# Patient Record
Sex: Female | Born: 1990 | Race: White | Hispanic: No | Marital: Single | Smoking: Light tobacco smoker
Health system: Southern US, Community
[De-identification: ages and names within clinical notes are randomized; demographics above are authoritative.]

## PROBLEM LIST (undated history)

## (undated) DIAGNOSIS — J45909 Unspecified asthma, uncomplicated: Secondary | ICD-10-CM

## (undated) DIAGNOSIS — IMO0002 Reserved for concepts with insufficient information to code with codable children: Secondary | ICD-10-CM

## (undated) HISTORY — PX: NO PAST SURGERIES: SHX2092

---
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
## 2009-04-27 ENCOUNTER — Emergency Department (HOSPITAL_COMMUNITY): Admission: EM | Admit: 2009-04-27 | Discharge: 2009-04-27 | Payer: Self-pay | Admitting: Emergency Medicine

## 2011-04-09 LAB — POCT PREGNANCY, URINE: Preg Test, Ur: NEGATIVE

## 2011-10-12 ENCOUNTER — Encounter: Payer: Self-pay | Admitting: *Deleted

## 2011-10-12 ENCOUNTER — Emergency Department (INDEPENDENT_AMBULATORY_CARE_PROVIDER_SITE_OTHER): Payer: Self-pay

## 2011-10-12 ENCOUNTER — Emergency Department (HOSPITAL_BASED_OUTPATIENT_CLINIC_OR_DEPARTMENT_OTHER)
Admission: EM | Admit: 2011-10-12 | Discharge: 2011-10-12 | Disposition: A | Discharge status: Home / Self Care | Payer: Self-pay | Attending: Emergency Medicine | Admitting: Emergency Medicine

## 2011-10-12 DIAGNOSIS — S86819A Strain of other muscle(s) and tendon(s) at lower leg level, unspecified leg, initial encounter: Secondary | ICD-10-CM | POA: Insufficient documentation

## 2011-10-12 DIAGNOSIS — M25569 Pain in unspecified knee: Secondary | ICD-10-CM

## 2011-10-12 DIAGNOSIS — S838X9A Sprain of other specified parts of unspecified knee, initial encounter: Secondary | ICD-10-CM | POA: Insufficient documentation

## 2011-10-12 DIAGNOSIS — S86919A Strain of unspecified muscle(s) and tendon(s) at lower leg level, unspecified leg, initial encounter: Secondary | ICD-10-CM

## 2011-10-12 DIAGNOSIS — F17200 Nicotine dependence, unspecified, uncomplicated: Secondary | ICD-10-CM | POA: Insufficient documentation

## 2011-10-12 DIAGNOSIS — X58XXXA Exposure to other specified factors, initial encounter: Secondary | ICD-10-CM

## 2011-10-12 DIAGNOSIS — W010XXA Fall on same level from slipping, tripping and stumbling without subsequent striking against object, initial encounter: Secondary | ICD-10-CM | POA: Insufficient documentation

## 2011-10-12 MED ORDER — TRAMADOL HCL 50 MG PO TABS: 50.0000 mg | ORAL_TABLET | Freq: Four times a day (QID) | ORAL | Status: AC | PRN

## 2011-10-12 NOTE — ED Notes (Signed)
Care plan pain  controll and follow up reviewed with Pt and family at side

## 2011-10-12 NOTE — ED Notes (Signed)
Knee immobilizer applied  With increased comfort Gait training with crutches pt demonstrates well

## 2011-10-12 NOTE — ED Provider Notes (Signed)
History     CSN: [id number] Arrival date & time: 10/12/2011  2:27 PM  Chief Complaint  Patient presents with  . Knee Injury    (Consider location/radiation/quality/duration/timing/severity/associated sxs/prior treatment) HPI Comments: Pt states that she tripped over a bucket and landed on her knee:pt states that she is having a problem bending the area:pt able to bend without any problem  Patient is a 20 y.o. female presenting with knee pain. The history is provided by the patient. No language interpreter was used.  Knee Pain This is a new problem. The current episode started yesterday. The problem occurs constantly. The problem has been unchanged. Pertinent negatives include no weakness. The symptoms are aggravated by nothing. She has tried nothing for the symptoms.    History reviewed. No pertinent past medical history.  History reviewed. No pertinent past surgical history.  No family history on file.  History  Substance Use Topics  . Smoking status: Current Everyday Smoker  . Smokeless tobacco: Not on file  . Alcohol Use: Yes    OB History    Grav Para Term Preterm Abortions TAB SAB Ect Mult Living                  Review of Systems  Constitutional: Negative.   Respiratory: Negative.   Cardiovascular: Negative.   Neurological: Negative for weakness.    Allergies  Review of patient's allergies indicates no known allergies.  Home Medications   Current Outpatient Rx  Name Route Sig Dispense Refill  . ORTHO TRI-CYCLEN (28) PO Oral Take by mouth.        BP 119/68  Pulse 88  Temp(Src) 98.3 F (36.8 C) (Oral)  Resp 18  Ht 5\' 2"  (1.575 m)  Wt 135 lb (61.236 kg)  BMI 24.69 kg/m2  SpO2 97%  LMP 10/05/2011  Physical Exam  Nursing note and vitals reviewed. Constitutional: She is oriented to person, place, and time. She appears well-developed and well-nourished.  Cardiovascular: Normal rate and regular rhythm.   Pulmonary/Chest: Effort normal and breath  sounds normal.  Musculoskeletal: Normal range of motion.       Pt has no gross deformity or swelling noted to the left knee  Neurological: She is alert and oriented to person, place, and time.  Skin: Skin is warm and dry.  Psychiatric: She has a normal mood and affect.    ED Course  Procedures (including critical care time)  Labs Reviewed - No data to display Dg Knee Complete 4 Views Left  10/12/2011  *RADIOLOGY REPORT*  Clinical Data: After injury.  LEFT KNEE - COMPLETE 4+ VIEW  Comparison: None.  Findings: No evidence for fracture or dislocation.  There is no joint effusion.  No worrisome lytic or sclerotic osseous lesion.  IMPRESSION: Normal exam.  Original Report Authenticated By: ERIC A. MANSELL, M.D.     1. Knee strain       MDM  Pt splinted for comfort:no obvious deformity        Teressa Lower, NP 10/12/11 1528

## 2011-10-12 NOTE — ED Notes (Signed)
Knee pain/knee pain with decreased mobility

## 2011-10-12 NOTE — ED Notes (Signed)
Pt states she tripped over a bucket and her left knee landed on a starter.

## 2011-10-12 NOTE — ED Notes (Signed)
Knee tender to Touch Ice pack applied

## 2011-10-13 NOTE — ED Provider Notes (Signed)
Medical screening examination/treatment/procedure(s) were performed by non-physician practitioner and as supervising physician I was immediately available for consultation/collaboration.   Yamin Swingler A Lyndzie Zentz, MD 10/13/11 2350 

## 2011-10-14 ENCOUNTER — Encounter (HOSPITAL_BASED_OUTPATIENT_CLINIC_OR_DEPARTMENT_OTHER): Payer: Self-pay | Admitting: Emergency Medicine

## 2011-10-14 ENCOUNTER — Emergency Department (HOSPITAL_BASED_OUTPATIENT_CLINIC_OR_DEPARTMENT_OTHER)
Admission: EM | Admit: 2011-10-14 | Discharge: 2011-10-14 | Disposition: A | Discharge status: Home / Self Care | Payer: Self-pay | Attending: Emergency Medicine | Admitting: Emergency Medicine

## 2011-10-14 DIAGNOSIS — M25569 Pain in unspecified knee: Secondary | ICD-10-CM | POA: Insufficient documentation

## 2011-10-14 DIAGNOSIS — W19XXXA Unspecified fall, initial encounter: Secondary | ICD-10-CM | POA: Insufficient documentation

## 2011-10-14 DIAGNOSIS — F17200 Nicotine dependence, unspecified, uncomplicated: Secondary | ICD-10-CM | POA: Insufficient documentation

## 2011-10-14 NOTE — ED Provider Notes (Signed)
History     CSN: [id number] Arrival date & time: 10/14/2011  5:16 PM   First MD Initiated Contact with Patient 10/14/11 1708      Chief Complaint  Patient presents with  . Knee Pain    L knee pain with decreased ROM seen 10-12-11 for knee injury    (Consider location/radiation/quality/duration/timing/severity/associated sxs/prior treatment) HPI Comments: Pt states that she fell 3 days ago and is continuing to have pain:pt states that she was seen and had an x-ray and it was negative  Patient is a 20 y.o. female presenting with knee pain. The history is provided by the patient. No language interpreter was used.  Knee Pain This is a new problem. The current episode started in the past 7 days. The problem occurs constantly. The problem has been unchanged. The symptoms are aggravated by bending. She has tried immobilization for the symptoms. The treatment provided no relief.    History reviewed. No pertinent past medical history.  History reviewed. No pertinent past surgical history.  History reviewed. No pertinent family history.  History  Substance Use Topics  . Smoking status: Current Everyday Smoker  . Smokeless tobacco: Not on file  . Alcohol Use: Yes    OB History    Grav Para Term Preterm Abortions TAB SAB Ect Mult Living                  Review of Systems  All other systems reviewed and are negative.    Allergies  Review of patient's allergies indicates no known allergies.  Home Medications   Current Outpatient Rx  Name Route Sig Dispense Refill  . ORTHO TRI-CYCLEN (28) PO Oral Take 1 tablet by mouth daily.     . TRAMADOL HCL 50 MG PO TABS Oral Take 1 tablet (50 mg total) by mouth every 6 (six) hours as needed for pain. 15 tablet 0    BP 110/69  Pulse 90  Temp 98.6 F (37 C)  Resp 20  SpO2 99%  LMP 10/05/2011  Physical Exam  Nursing note and vitals reviewed. Constitutional: She is oriented to person, place, and time. She appears well-developed  and well-nourished.  Cardiovascular: Normal rate and regular rhythm.   Pulmonary/Chest: Effort normal and breath sounds normal.  Musculoskeletal: Normal range of motion. She exhibits no edema and no tenderness.  Neurological: She is alert and oriented to person, place, and time.  Skin: Skin is warm and dry.    ED Course  Procedures (including critical care time)  Labs Reviewed - No data to display No results found.   No diagnosis found.    MDM  Pt had x-ray 3 days ago:no acute findings:pt has no abnormalities noted to the knee       Teressa Lower, NP 10/14/11 1738

## 2011-10-14 NOTE — ED Provider Notes (Signed)
Medical screening examination/treatment/procedure(s) were performed by non-physician practitioner and as supervising physician I was immediately available for consultation/collaboration.  Shelda Jakes, MD 10/14/11 [phone]

## 2011-10-14 NOTE — ED Notes (Signed)
Pt reports L knee is painful with decreased ROM followed all of care plan as directed

## 2012-10-22 DIAGNOSIS — N390 Urinary tract infection, site not specified: Secondary | ICD-10-CM | POA: Insufficient documentation

## 2012-10-22 DIAGNOSIS — F17200 Nicotine dependence, unspecified, uncomplicated: Secondary | ICD-10-CM | POA: Insufficient documentation

## 2012-10-22 DIAGNOSIS — R0982 Postnasal drip: Secondary | ICD-10-CM | POA: Insufficient documentation

## 2012-10-22 DIAGNOSIS — Z79899 Other long term (current) drug therapy: Secondary | ICD-10-CM | POA: Insufficient documentation

## 2012-10-23 ENCOUNTER — Emergency Department (HOSPITAL_BASED_OUTPATIENT_CLINIC_OR_DEPARTMENT_OTHER): Payer: Self-pay

## 2012-10-23 ENCOUNTER — Emergency Department (HOSPITAL_BASED_OUTPATIENT_CLINIC_OR_DEPARTMENT_OTHER)
Admission: EM | Admit: 2012-10-23 | Discharge: 2012-10-23 | Disposition: A | Discharge status: Home / Self Care | Payer: Self-pay | Attending: Emergency Medicine | Admitting: Emergency Medicine

## 2012-10-23 ENCOUNTER — Encounter (HOSPITAL_BASED_OUTPATIENT_CLINIC_OR_DEPARTMENT_OTHER): Payer: Self-pay | Admitting: *Deleted

## 2012-10-23 DIAGNOSIS — R0982 Postnasal drip: Secondary | ICD-10-CM

## 2012-10-23 DIAGNOSIS — N390 Urinary tract infection, site not specified: Secondary | ICD-10-CM

## 2012-10-23 LAB — URINALYSIS, ROUTINE W REFLEX MICROSCOPIC
Nitrite: NEGATIVE
Protein, ur: NEGATIVE mg/dL
Urobilinogen, UA: 0.2 mg/dL (ref 0.0–1.0)

## 2012-10-23 LAB — PREGNANCY, URINE: Preg Test, Ur: NEGATIVE

## 2012-10-23 LAB — URINE MICROSCOPIC-ADD ON

## 2012-10-23 MED ORDER — FEXOFENADINE HCL 60 MG PO TABS: 60.0000 mg | ORAL_TABLET | Freq: Two times a day (BID) | ORAL | Status: DC

## 2012-10-23 MED ORDER — FLUTICASONE PROPIONATE 50 MCG/ACT NA SUSP: 2.0000 | Freq: Every day | NASAL | Status: DC

## 2012-10-23 MED ORDER — NITROFURANTOIN MONOHYD MACRO 100 MG PO CAPS: 100.0000 mg | ORAL_CAPSULE | Freq: Two times a day (BID) | ORAL | Status: DC

## 2012-10-23 MED ORDER — LORATADINE 10 MG PO TABS
10.0000 mg | ORAL_TABLET | Freq: Once | ORAL | Status: AC
  Administered 2012-10-23: 10 mg via ORAL
  Filled 2012-10-23: qty 1

## 2012-10-23 NOTE — ED Provider Notes (Signed)
History     CSN: [id number]  Arrival date & time 10/22/12  2359   First MD Initiated Contact with Patient 10/23/12 [phone]      Chief Complaint  Patient presents with  . Cough    (Consider location/radiation/quality/duration/timing/severity/associated sxs/prior treatment) Patient is a 21 y.o. female presenting with cough. The history is provided by the patient. No language interpreter was used.  Cough This is a new problem. The current episode started more than 1 week ago. The problem occurs every few minutes. The problem has not changed since onset.The cough is non-productive. There has been no fever. Associated symptoms include rhinorrhea. Pertinent negatives include no chest pain, no chills, no sweats, no weight loss, no ear congestion, no ear pain, no headaches, no sore throat, no myalgias and no shortness of breath. She has tried nothing for the symptoms. The treatment provided no relief. She is a smoker. Her past medical history does not include pneumonia.  Also suprapubic tenderness x 1 week.  No vaginal discharge.  No flank pain no n/v/d.    History reviewed. No pertinent past medical history.  History reviewed. No pertinent past surgical history.  History reviewed. No pertinent family history.  History  Substance Use Topics  . Smoking status: Current Every Day Smoker  . Smokeless tobacco: Not on file  . Alcohol Use: Yes    OB History    Grav Para Term Preterm Abortions TAB SAB Ect Mult Living                  Review of Systems  Constitutional: Negative for chills and weight loss.  HENT: Positive for rhinorrhea. Negative for ear pain and sore throat.   Respiratory: Positive for cough. Negative for shortness of breath.   Cardiovascular: Negative for chest pain, palpitations and leg swelling.  Genitourinary: Negative for dysuria, flank pain, decreased urine volume and vaginal discharge.  Musculoskeletal: Negative for myalgias.  Neurological: Negative for headaches.    All other systems reviewed and are negative.    Allergies  Review of patient's allergies indicates no known allergies.  Home Medications   Current Outpatient Rx  Name Route Sig Dispense Refill  . ORTHO TRI-CYCLEN (28) PO Oral Take 1 tablet by mouth daily.       BP 128/63  Pulse 86  Temp 98.6 F (37 C) (Oral)  Resp 16  Ht 5\' 2"  (1.575 m)  Wt 120 lb (54.432 kg)  BMI 21.95 kg/m2  SpO2 98%  LMP 09/20/2012  Physical Exam  Constitutional: She is oriented to person, place, and time. She appears well-developed and well-nourished. No distress.  HENT:  Head: Normocephalic and atraumatic.  Right Ear: External ear normal.  Left Ear: External ear normal.  Mouth/Throat: Oropharynx is clear and moist.       Oropharyngeal cobblestoning  Eyes: Conjunctivae normal are normal. Pupils are equal, round, and reactive to light.  Neck: Normal range of motion. Neck supple.  Cardiovascular: Normal rate and regular rhythm.   Pulmonary/Chest: Effort normal and breath sounds normal. She has no wheezes. She has no rales.  Abdominal: Soft. Bowel sounds are normal. There is no tenderness. There is no rebound and no guarding.  Musculoskeletal: Normal range of motion.  Neurological: She is alert and oriented to person, place, and time.  Skin: Skin is warm and dry.  Psychiatric: She has a normal mood and affect.    ED Course  Procedures (including critical care time)  Labs Reviewed  URINALYSIS, ROUTINE W REFLEX MICROSCOPIC -  Abnormal; Notable for the following:    APPearance CLOUDY (*)     Hgb urine dipstick LARGE (*)     Leukocytes, UA TRACE (*)     All other components within normal limits  URINE MICROSCOPIC-ADD ON - Abnormal; Notable for the following:    Squamous Epithelial / LPF FEW (*)     Bacteria, UA MANY (*)     Crystals CA OXALATE CRYSTALS (*)     All other components within normal limits  PREGNANCY, URINE  URINE CULTURE   Dg Chest 2 View  10/23/2012  *RADIOLOGY REPORT*   Clinical Data: Cough  CHEST - 2 VIEW  Comparison: None.  Findings: Mild bronchitic changes.  No focal consolidation.  No pleural effusion or pneumothorax.  Cardiomediastinal silhouette is within normal limits.  Visualized osseous structures are within normal limits.  IMPRESSION: Mild bronchitic changes.   Original Report Authenticated By: Charline Bills, M.D.      No diagnosis found.   MDM  Will treat for UTI and post nasal drip.  Return for fevers chills flank pain or any concerns        Crickett Abbett K Oluwaseyi Raffel-Rasch, MD 10/23/12 1610

## 2012-10-23 NOTE — ED Notes (Signed)
Nonproductive cough x 1 week, denies fever. Also c/o clear nasal drainage.

## 2012-10-25 LAB — URINE CULTURE

## 2012-10-26 NOTE — ED Notes (Signed)
+   Urine Patient treated with Macrobid-Sensitive to same-chart appended per protocol MD.

## 2013-01-05 ENCOUNTER — Emergency Department (HOSPITAL_COMMUNITY)
Admission: EM | Admit: 2013-01-05 | Discharge: 2013-01-05 | Disposition: A | Discharge status: Home / Self Care | Payer: Self-pay | Attending: Emergency Medicine | Admitting: Emergency Medicine

## 2013-01-05 ENCOUNTER — Encounter (HOSPITAL_COMMUNITY): Payer: Self-pay

## 2013-01-05 DIAGNOSIS — Z79899 Other long term (current) drug therapy: Secondary | ICD-10-CM | POA: Insufficient documentation

## 2013-01-05 DIAGNOSIS — J45909 Unspecified asthma, uncomplicated: Secondary | ICD-10-CM | POA: Insufficient documentation

## 2013-01-05 DIAGNOSIS — F17200 Nicotine dependence, unspecified, uncomplicated: Secondary | ICD-10-CM | POA: Insufficient documentation

## 2013-01-05 DIAGNOSIS — Z3202 Encounter for pregnancy test, result negative: Secondary | ICD-10-CM | POA: Insufficient documentation

## 2013-01-05 DIAGNOSIS — R112 Nausea with vomiting, unspecified: Secondary | ICD-10-CM | POA: Insufficient documentation

## 2013-01-05 HISTORY — DX: Unspecified asthma, uncomplicated: J45.909

## 2013-01-05 LAB — URINALYSIS, ROUTINE W REFLEX MICROSCOPIC
Bilirubin Urine: NEGATIVE
Specific Gravity, Urine: >1.03 — ABNORMAL HIGH (ref 1.005–1.030)
Urobilinogen, UA: 0.2 mg/dL (ref 0.0–1.0)

## 2013-01-05 LAB — POCT I-STAT, CHEM 8
BUN: 16 mg/dL (ref 6–23)
Creatinine, Ser: 0.8 mg/dL (ref 0.50–1.10)
Potassium: 4.4 mEq/L (ref 3.5–5.1)
Sodium: 140 mEq/L (ref 135–145)

## 2013-01-05 LAB — URINE MICROSCOPIC-ADD ON

## 2013-01-05 MED ORDER — ONDANSETRON HCL 4 MG PO TABS: 8.0000 mg | ORAL_TABLET | Freq: Four times a day (QID) | ORAL | Status: AC

## 2013-01-05 MED ORDER — ONDANSETRON 4 MG PO TBDP
8.0000 mg | ORAL_TABLET | Freq: Once | ORAL | Status: AC
  Administered 2013-01-05: 8 mg via ORAL
  Filled 2013-01-05: qty 2

## 2013-01-05 NOTE — ED Provider Notes (Signed)
History     CSN: [id number]  Arrival date & time 01/05/13  1441   First MD Initiated Contact with Patient 01/05/13 [phone]      Chief Complaint  Patient presents with  . Emesis    (Consider location/radiation/quality/duration/timing/severity/associated sxs/prior treatment) HPI Patient has vomited multiple times today. She presently complains of nausea. She denies chest pain denies headache denies abdominal pain last normal menstrual period was 4 days ago. No fever no other complaint treated herself with Tylenol yesterday after having dental work done to 2 tablets. No other complaint presently except nausea. She is not lightheaded upon standing. Past Medical History  Diagnosis Date  . Asthma     History reviewed. No pertinent past surgical history.  History reviewed. No pertinent family history.  History  Substance Use Topics  . Smoking status: Current Every Day Smoker  . Smokeless tobacco: Not on file  . Alcohol Use: Yes    OB History    Grav Para Term Preterm Abortions TAB SAB Ect Mult Living                  Review of Systems  Gastrointestinal: Positive for nausea and vomiting.  All other systems reviewed and are negative.    Allergies  Review of patient's allergies indicates no known allergies.  Home Medications   Current Outpatient Rx  Name  Route  Sig  Dispense  Refill  . ACETAMINOPHEN 500 MG PO TABS   Oral   Take 500 mg by mouth every 6 (six) hours as needed. For pain         . ALBUTEROL SULFATE HFA 108 (90 BASE) MCG/ACT IN AERS   Inhalation   Inhale 2 puffs into the lungs every 6 (six) hours as needed. For asthma           BP 113/60  Pulse 84  Temp 97.5 F (36.4 C) (Oral)  SpO2 96%  Physical Exam  Nursing note and vitals reviewed. Constitutional: She is oriented to person, place, and time. She appears well-developed and well-nourished. No distress.  HENT:  Head: Normocephalic and atraumatic.  Eyes: Conjunctivae normal are normal. Pupils  are equal, round, and reactive to light.       Funduscopic exam normal  Neck: Neck supple. No tracheal deviation present. No thyromegaly present.  Cardiovascular: Normal rate and regular rhythm.   No murmur heard. Pulmonary/Chest: Effort normal and breath sounds normal.  Abdominal: Soft. Bowel sounds are normal. She exhibits no distension. There is no tenderness.  Musculoskeletal: Normal range of motion. She exhibits no edema and no tenderness.  Neurological: She is alert and oriented to person, place, and time. Coordination normal.       Gait normal not lightheaded on standing  Skin: Skin is warm and dry. No rash noted.  Psychiatric: She has a normal mood and affect.    ED Course  Procedures (including critical care time)  Labs Reviewed - No data to display No results found.  Results for orders placed during the hospital encounter of 01/05/13  URINALYSIS, ROUTINE W REFLEX MICROSCOPIC      Component Value Range   Color, Urine YELLOW  YELLOW   APPearance HAZY (*) CLEAR   Specific Gravity, Urine >1.030 (*) 1.005 - 1.030   pH 5.5  5.0 - 8.0   Glucose, UA NEGATIVE  NEGATIVE mg/dL   Hgb urine dipstick LARGE (*) NEGATIVE   Bilirubin Urine NEGATIVE  NEGATIVE   Ketones, ur NEGATIVE  NEGATIVE mg/dL   Protein,  ur NEGATIVE  NEGATIVE mg/dL   Urobilinogen, UA 0.2  0.0 - 1.0 mg/dL   Nitrite NEGATIVE  NEGATIVE   Leukocytes, UA NEGATIVE  NEGATIVE  POCT PREGNANCY, URINE      Component Value Range   Preg Test, Ur NEGATIVE  NEGATIVE  POCT I-STAT, CHEM 8      Component Value Range   Sodium 140  135 - 145 mEq/L   Potassium 4.4  3.5 - 5.1 mEq/L   Chloride 108  96 - 112 mEq/L   BUN 16  6 - 23 mg/dL   Creatinine, Ser 8.11  0.50 - 1.10 mg/dL   Glucose, Bld 99  70 - 99 mg/dL   Calcium, Ion 9.14  1.12 - 1.23 mmol/L   TCO2 23  0 - 100 mmol/L   Hemoglobin 17.7 (*) 12.0 - 15.0 g/dL   HCT 78.2 (*) 95.6 - 21.3 %  URINE MICROSCOPIC-ADD ON      Component Value Range   Squamous Epithelial / LPF MANY  (*) RARE   WBC, UA 0-2  <3 WBC/hpf   RBC / HPF 0-2  <3 RBC/hpf   Bacteria, UA FEW (*) RARE   Urine-Other MUCOUS PRESENT     No results found.  No diagnosis found.  7 PM asymptomatic after treatment with Zofran  MDM  Etiology for nausea and vomiting unclear. Patient has not exhibited any signs of dehydration Plan prescription Zofran Referral resource guide  Diagnosis nausea and vomitting      Doug Sou, MD 01/05/13 0865

## 2013-01-05 NOTE — ED Notes (Signed)
Woke up today vomiting, diaphoretic.

## 2014-04-17 ENCOUNTER — Other Ambulatory Visit: Payer: Self-pay | Admitting: Obstetrics and Gynecology

## 2014-04-17 DIAGNOSIS — O3680X0 Pregnancy with inconclusive fetal viability, not applicable or unspecified: Secondary | ICD-10-CM

## 2014-04-19 ENCOUNTER — Ambulatory Visit (INDEPENDENT_AMBULATORY_CARE_PROVIDER_SITE_OTHER): Payer: Medicaid Other

## 2014-04-19 ENCOUNTER — Encounter: Payer: Self-pay | Admitting: Obstetrics and Gynecology

## 2014-04-19 ENCOUNTER — Ambulatory Visit (INDEPENDENT_AMBULATORY_CARE_PROVIDER_SITE_OTHER): Payer: Medicaid Other | Admitting: Obstetrics and Gynecology

## 2014-04-19 ENCOUNTER — Other Ambulatory Visit: Payer: Self-pay | Admitting: Obstetrics and Gynecology

## 2014-04-19 VITALS — BP 100/60 | Wt 127.0 lb

## 2014-04-19 DIAGNOSIS — O358XX0 Maternal care for other (suspected) fetal abnormality and damage, not applicable or unspecified: Secondary | ICD-10-CM

## 2014-04-19 DIAGNOSIS — Z331 Pregnant state, incidental: Secondary | ICD-10-CM

## 2014-04-19 DIAGNOSIS — IMO0002 Reserved for concepts with insufficient information to code with codable children: Secondary | ICD-10-CM

## 2014-04-19 DIAGNOSIS — Z1389 Encounter for screening for other disorder: Secondary | ICD-10-CM

## 2014-04-19 DIAGNOSIS — O3680X0 Pregnancy with inconclusive fetal viability, not applicable or unspecified: Secondary | ICD-10-CM

## 2014-04-19 DIAGNOSIS — Q793 Gastroschisis: Secondary | ICD-10-CM

## 2014-04-19 LAB — POCT URINALYSIS DIPSTICK
GLUCOSE UA: NEGATIVE
KETONES UA: NEGATIVE
Leukocytes, UA: NEGATIVE
Nitrite, UA: NEGATIVE
Protein, UA: NEGATIVE
RBC UA: NEGATIVE

## 2014-04-19 NOTE — Progress Notes (Signed)
Pt had US done today in our office and needs to go over findings with Dr. Emelda FearFerguson.

## 2014-04-19 NOTE — Progress Notes (Signed)
U/S(11+3wks)-CRL c/w LMP dates, bilateral adnexa appears WNL, FHR-167 BPM, ABDOMINAL WALL DEFECT NOTED FETAL STOMACH NOTED OUTSIDE OF ABDOMINAL CAVITY, HEART NOTED LOWER IN CHEST/ABDOMINAL CAVITY (UNABLE TO DIFFERENTIATE OOMPHALOCELE VS. GASTROSCHISIS),

## 2014-04-19 NOTE — Progress Notes (Signed)
U/S(11+3wks)-CRL c/w LMP dates, bilateral adnexa appears WNL, FHR-167 BPM, ABDOMINAL WALL DEFECT NOTED FETAL STOMACH NOTED OUTSIDE OF ABDOMINAL CAVITY, HEART NOTED LOWER IN CHEST/ABDOMINAL CAVITY (UNABLE TO DIFFERENTIATE OMPHALOCELE VS. GASTROSCHISIS), ?unable to clearly visualize normal spine curvature  Discussed U/S results with FOB and pt and had lengthy discussion as to risks and concerns. [facility]. Pt's questions answered to apparent satisfaction. [facility].  A: gastroschisis P:specialist f/u, return to office in 2 weeks

## 2014-04-21 ENCOUNTER — Telehealth: Payer: Self-pay

## 2014-04-21 NOTE — Telephone Encounter (Signed)
[facility] for Son[facility] and Genetic Specialist. Pt aware, records to be faxed.

## 2014-04-21 NOTE — Telephone Encounter (Signed)
Pt states Dr. Emelda FearFerguson was to refer [facility] due to fetal abnormalities identified on 04/19/2014 ultrasound. Informed pt Dr. Emelda FearFerguson our of office today will be back in the office on Monday will discuss with Dr. Despina HiddenEure who is in the office today.

## 2014-04-21 NOTE — Telephone Encounter (Signed)
As [facility]

## 2014-04-24 ENCOUNTER — Other Ambulatory Visit: Payer: Self-pay | Admitting: Obstetrics & Gynecology

## 2014-04-24 DIAGNOSIS — IMO0002 Reserved for concepts with insufficient information to code with codable children: Secondary | ICD-10-CM

## 2014-04-24 DIAGNOSIS — Z3689 Encounter for other specified antenatal screening: Secondary | ICD-10-CM

## 2014-04-24 DIAGNOSIS — O358XX0 Maternal care for other (suspected) fetal abnormality and damage, not applicable or unspecified: Secondary | ICD-10-CM

## 2014-04-28 ENCOUNTER — Ambulatory Visit (HOSPITAL_COMMUNITY)
Admission: RE | Admit: 2014-04-28 | Discharge: 2014-04-28 | Disposition: A | Discharge status: Home / Self Care | Payer: Medicaid Other | Source: Ambulatory Visit | Attending: Obstetrics & Gynecology | Admitting: Obstetrics & Gynecology

## 2014-04-28 ENCOUNTER — Other Ambulatory Visit: Payer: Self-pay | Admitting: Obstetrics & Gynecology

## 2014-04-28 ENCOUNTER — Encounter (HOSPITAL_COMMUNITY): Payer: Self-pay

## 2014-04-28 DIAGNOSIS — Z3689 Encounter for other specified antenatal screening: Secondary | ICD-10-CM

## 2014-04-28 DIAGNOSIS — O358XX0 Maternal care for other (suspected) fetal abnormality and damage, not applicable or unspecified: Secondary | ICD-10-CM | POA: Insufficient documentation

## 2014-04-28 DIAGNOSIS — IMO0002 Reserved for concepts with insufficient information to code with codable children: Secondary | ICD-10-CM | POA: Diagnosis not present

## 2014-04-28 NOTE — Progress Notes (Signed)
[facility]  Indication: 22 yr old G1P0 at [redacted]w[redacted]d with fetal abdominal wall defect seen on outside ultrasound for fetal ultrasound.  Findings: 1. Single intrauterine pregnancy. 2. Fetal crown rump length is consistent with dating. 3. Normal uterus; no adnexal masses seen. 4. Evaluation of fetal anatomy is limited by early gestational age. 5. 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 6. The fetal heart is displaced inferiorly. 7. The nuchal translucency appears enlarged.  Recommendations: 1. Appropriate dating. 2. 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 (discussed it contains liver and stomach) - discussed association with increased risk of fetal aneuploidy - discussed association with other anomalies (can be up to 70-80% chance of associated anomalies) - discussed prognosis depends on presence of associated anomalies and if there is a eupoloid or aneuploid fetus or other genetic condition - discussed prognosis also somewhat depends on the size of the defect - discussed need for possible multiple neonatal surgeries - discussed options of aneuploidy screening (see genetic counselor report) - discussed option of termination  Patient and her partner are undecided how they would like to proceed at this point- they will call us to let us know. If patient continues the pregnancy recommend detailed anatomic survey, fetal echocardiogram, serial fetal growth surveillance, and consultation with Pediatric Surgery  Discussed significantly increased risk of miscarriage and fetal demise Discussed will evaluate for associated anomalies on follow up ultrasounds as anatomy is limited today by early gestational age 3. Thickened  nuchal translucency: - see genetic counselor report - discussed association with increased risk of aneuploidy, major fetal anomalies, and fetal loss  Kristen Quinn, MD 

## 2014-04-28 NOTE — Progress Notes (Signed)
Genetic Counseling  High-Risk Gestation Note  Appointment Date:  04/28/2014 Referred By: Florian Buff, MD Date of Birth:  [birth year] Partner:  Earmon Phoenix   Pregnancy History: G1P0 Estimated Date of Delivery: 11/05/14 Estimated Gestational Age: 51w5dAttending: [place] MD   I met with Ms. ACitigroupand her partner, Mr. CEarmon Phoenix for genetic counseling because of abnormal ultrasound findings.  We began by reviewing the ultrasound in detail. Ultrasound performed at the time of today's visit confirmed the finding of abdominal wall defect. 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 The fetal heart is displaced inferiorly. Additionally, an enlarged nuchal translucency was visualized. Complete ultrasound results reported separately.     This couple was counseled that an omphalocele occurs in ~1 in every 4000 births (M1:F5) and is defined as the protrusion of abdominal viscera through the umbilical ring, covered by membrane.  This defect is thought to arise from failure of lateral body migration and body wall closure or from the embryonic persistence of the body stalk.  We discussed that approximately two thirds of all cases have associated anomalies, most commonly including: cardiac defects, neural tube defects, and cleft lip with or without palate.  We reviewed the common causes of omphaloceles, including sporadic, multifactorial, and genetic etiologies.  Specifically, we discussed that omphaloceles are associated with a 30-50% risk of fetal aneuploidy, complexes such as OEIS, and genetic syndromes including Beckwith-Wiedemann syndrome (BWS) and single gene conditions.  We briefly reviewed recessive and dominant inheritances, since there are reports of familial nonsyndromic omphaloceles. Various common etiologies for an increased NT include aneuploidy, single gene conditions, cardiac or great vessel abnormalities, lymphatic system failure, decreased fetal movement, and  fetal anemia.       We reviewed chromosomes, nondisjunction, and examples of fetal aneuploidy, including trisomy 179and trisomy 135 We reviewed available screening and diagnostic options.  Regarding screening tests, we discussed the options of noninvasive prenatal screening (NIPS)/cell free DNA testing and ultrasound.  They understand that screening tests are used to modify a patient's a priori risk for aneuploidy, typically based on age.  This estimate provides a pregnancy specific risk assessment. Specifically, we discussed the conditions for which each test screens, the detection rates, and false positive rates of each.  We also reviewed the availability of diagnostic options including CVS and amniocentesis.  We discussed the risks, limitations, and benefits of each including the associated 1 in 1450risk for complications from CVS and the associated 1 in 3388-828risk for complications from amniocentesis.  She understands that these screens and tests cannot rule out all birth defects or genetic syndromes. After careful consideration, the couple declined pursuing additional screening or testing at this time. Ms. MValdiviaindicated that she needed additional time to further consider screening and testing options. She plans to call our office back should she desire NIPS or CVS/amniocentesis.   We discussed management and prognosis.  They understand that the overall prognosis depends upon the underlying etiology; however, if isolated and nonsyndromic, the prognosis for omphalocele is relatively good, though risk for adverse pregnancy outcomes is increased. We discussed that postnatal surgical management would be more involved for large omphalocele. We discussed the options of continuing the pregnancy versus termination of pregnancy. The couple understands that [place].  We reviewed the option of meeting with a pediatric surgeon to review  the surgical approach and postnatal management.  In addition, we also discussed  the importance of delivering in tertiary care center, to optimize care of the newborn.   We also discussed the recommendation for fetal echocardiogram, given the increased association of omphaloceles with CHDs and detailed ultrasound in the second trimester. The couple indicated that they need additional time to think about how they would like to proceed with the pregnancy. Follow-up ultrasound was planned for 05/19/14.   Both family histories were reviewed and found to be noncontributory for birth defects, intellectual disability, and known genetic conditions. Without further information regarding the provided family history, an accurate genetic risk cannot be calculated. Further genetic counseling is warranted if more information is obtained.  Ms. Wallner was provided with written information regarding cystic fibrosis (CF) including the carrier frequency and incidence in the Caucasian population, the availability of carrier testing and prenatal diagnosis if indicated.  In addition, we discussed that CF is routinely screened for as part of the [place] newborn screening panel.  She declined further discussion regarding CF carrier screening at this time.    Ms. Riga denied exposure to environmental toxins or chemical agents. She denied the use of alcohol or street drugs. She reported smoking 2 cigarettes per day. The associations of smoking in pregnancy were reviewed and cessation encouraged. She denied significant viral illnesses during the course of her pregnancy. Her medical and surgical histories were noncontributory.   I counseled this couple regarding the above risks and available options.  The approximate face-to-face time with the genetic counselor was 35 minutes.  Kandra Nicolas Gildardo Griffes, MS Certified Genetic Counselor 04/28/2014

## 2014-04-30 ENCOUNTER — Encounter: Payer: Self-pay | Admitting: Obstetrics and Gynecology

## 2014-04-30 DIAGNOSIS — O283 Abnormal ultrasonic finding on antenatal screening of mother: Secondary | ICD-10-CM | POA: Insufficient documentation

## 2014-04-30 DIAGNOSIS — O358XX0 Maternal care for other (suspected) fetal abnormality and damage, not applicable or unspecified: Secondary | ICD-10-CM | POA: Insufficient documentation

## 2014-05-01 ENCOUNTER — Telehealth (HOSPITAL_COMMUNITY): Payer: Self-pay | Admitting: MS"

## 2014-05-01 NOTE — Telephone Encounter (Signed)
Ms. Patricia Orr called back and stated that after consideration over the weekend, she and her partner have decided to terminate the pregnancy given the ultrasound findings. I discussed that our group could facilitate this and reviewed that [facility]. I reviewed that the cost is higher if performed through a medical center compared to a clinic setting. However, [facility] and [facility] have the option to apply for charity care. Ms. Patricia Orr does not currently have insurance. She was in the process of applying for pregnancy Medicaid. I explained that from my understanding the criteria to be approved for charity care were similar to being approved for medicaid. She inquired about options at different clinics instead of going through our group. Discussed that my understanding is that there was [place] in [place], but it is now closed. Discussed with the patient that [facility] in [place] performs termination of pregnancy, and [facility] in [place]. She understands that we have no affiliation with these clinics and are not familiar with the care provided. Ms. Patricia Orr inquired about whether or not her primary OB could perform the procedure. I stated that I was unsure but offered to call and discuss with her OB provider. She stated that she would plan to call them and ask herself. I encouraged Ms. Patricia Orr to call us back if we can provided additional help.   Helyn AppKaren Louise Ech Kendrew Orr 05/01/2014 12:21 PM

## 2014-05-03 ENCOUNTER — Encounter: Payer: Self-pay | Admitting: Advanced Practice Midwife

## 2014-05-19 ENCOUNTER — Ambulatory Visit (HOSPITAL_COMMUNITY): Payer: Self-pay

## 2014-05-30 ENCOUNTER — Ambulatory Visit: Payer: Self-pay | Admitting: Obstetrics and Gynecology

## 2014-10-30 ENCOUNTER — Encounter (HOSPITAL_COMMUNITY): Payer: Self-pay

## 2015-01-19 ENCOUNTER — Ambulatory Visit (HOSPITAL_COMMUNITY): Payer: Medicaid Other

## 2015-01-23 ENCOUNTER — Other Ambulatory Visit (HOSPITAL_COMMUNITY): Payer: Self-pay

## 2015-01-23 ENCOUNTER — Encounter (HOSPITAL_COMMUNITY): Payer: Self-pay

## 2015-01-23 ENCOUNTER — Ambulatory Visit (HOSPITAL_COMMUNITY)
Admission: RE | Admit: 2015-01-23 | Discharge: 2015-01-23 | Disposition: A | Discharge status: Home / Self Care | Payer: Medicaid Other | Source: Ambulatory Visit | Attending: Nurse Practitioner | Admitting: Nurse Practitioner

## 2015-01-23 DIAGNOSIS — O09899 Supervision of other high risk pregnancies, unspecified trimester: Secondary | ICD-10-CM

## 2015-01-23 DIAGNOSIS — Z3A14 14 weeks gestation of pregnancy: Secondary | ICD-10-CM | POA: Insufficient documentation

## 2015-01-23 DIAGNOSIS — Z8279 Family history of other congenital malformations, deformations and chromosomal abnormalities: Secondary | ICD-10-CM

## 2015-01-23 DIAGNOSIS — O280 Abnormal hematological finding on antenatal screening of mother: Principal | ICD-10-CM

## 2015-01-23 NOTE — Progress Notes (Signed)
Genetic Counseling  High-Risk Gestation Note  Appointment Date:  01/23/2015 Referred By: Orbie HurstErskine, Kelly B, NP Date of Birth:  02-13-1991 Partner:  Mariane Baumgartenhase Ruch   Pregnancy History: [id number] Estimated Date of Delivery: 07/18/15 Estimated Gestational Age: 11048w6d Attending: Particia NearingMartha Decker, MD   Ms. IKON Office Solutionsmber Pain and her partner, Mr. Laddie AquasChase Rush, were seen for genetic counseling because of an increased risk for fetal Down syndrome based on First trimester screening performed through [facility]. They were accompanied by the patient's cousin to today's visit.   They were counseled regarding the First trimester screen result and the associated 1 in 170 risk for fetal Down syndrome. We discussed that additionally the risk for fetal Trisomy 18 was also increased to 1 in 170. However, this is below the screen's cutoff of 1 in 100 for trisomy 18 and thus was not considered a screen positive result for trisomy 18.  We reviewed chromosomes, nondisjunction, and the common features and variable prognosis of Down syndrome and poor prognosis of trisomy 8518. We also discussed other explanations for a screen positive result including: a gestational dating error, differences in maternal metabolism, and normal variation. They understand that this screening is not diagnostic for these conditions but provides a risk assessment. We specifically discussed that the level of one of the proteins analyzed on the screen, PAPP-A, was very low (0.31 MoM).  This has been associated with an increased risk for growth restriction or poor pregnancy outcome later in pregnancy; therefore, we would recommend offering a follow up ultrasound for fetal growth in the third trimester.  We also reviewed the results of noninvasive prenatal screening (NIPS)/cell free DNA (cfDNA) testing that was facilitated through the patient's OB office ([facility]). We reviewed that these are within normal limits, showing a less than 1  in 10,000 risk for trisomies 21, 18 and 13, and monosomy X (Turner syndrome).  In addition, the risk for triploidy/vanishing twin and sex chromosome trisomies (47,XXX and 47,XXY) was also low risk.  Screening for 22q11 deletion syndrome was also performed and was also low risk (<1 in 13,300).  We reviewed that this testing identifies > 99% of pregnancies with trisomy 4621, trisomy 2713, sex chromosome trisomies (47,XXX and 47,XXY), and triploidy. The detection rate for trisomy 18 is 96%.  The detection rate for monosomy X is ~92%.  The false positive rate is <0.1% for all conditions. Testing was also consistent with female fetal sex.  The patient did wish to know fetal sex.  We discussed that NIPS is not diagnostic for these conditions and does not assess for all chromosome or genetic conditions.   We discussed additional screening option of detailed ultrasound in the second trimester. We reviewed the benefits and limitations of each screening option. Specifically, we discussed the conditions for which each test screens, the detection rates, and false positive rates of each. They were also counseled regarding diagnostic testing via amniocentesis. We reviewed the approximate 1 in 300-500 risk for complications for amniocentesis, including spontaneous pregnancy loss. After consideration of all the options, they elected to proceed with targeted ultrasound only, which was scheduled for 02/13/15.  Diagnostic testing (amniocentesis) was declined today.  They understand that screening tests cannot rule out all birth defects or genetic syndromes. The patient was advised of this limitation and states she still does not want additional testing at this time.   Ms. Hollice Gongmber Anstey was provided with written information regarding cystic fibrosis (CF) including the carrier frequency and incidence in the Caucasian population,  the availability of carrier testing and prenatal diagnosis if indicated.  In addition, we discussed that CF  is routinely screened for as part of the [place] newborn screening panel.  She declined CF testing today.   Both family histories were updated from the patient's 04/28/14 genetic counseling visit and were contributory for the fetal omphalocele present in the couple's previous pregnancy. [facility]. The patient elected to terminate the pregnancy given the ultrasound finding of omphalocele, which was performed in [place]. She did not pursue genetic or chromosome testing in the pregnancy, and no chromosome analysis was reportedly performed on products of conception. Thus, the underlying cause for omphalocele in that pregnancy in 2015 is not known.   An omphalocele occurs in approximately in every 4000 births (M1:F5) and is defined as the protrusion of abdominal viscera through the umbilical ring, covered by membrane. This defect is thought to arise from failure of lateral body migration and body wall closure or from the embryonic persistence of the body stalk. We discussed that about two thirds of all cases have associated anomalies, most commonly including: cardiac defects, neural tube defects, and cleft lip with or without palate.  We reviewed the common causes of omphaloceles, including sporadic, multifactorial, and genetic etiologies. Specifically, we discussed that omphaloceles are associated with a 30-50% risk of fetal aneuploidy (trisomies 13/18), other chromosome aberrations (microdeletions, microduplications, translocations, insertions), complexes such as OEIS, and genetic syndromes including Beckwith-Wiedemann syndrome (BWS) and specific single gene conditions.  In cases of isolated omphalocele, sporadic occurrence is most likely. Familial cases of isolated omphalocele have been reported. We discussed that in the absence of a known underlying cause, recurrence risk cannot accurately be assessed but would likely be low in the case  of sporadic occurrence. Targeted ultrasound is available to assess for abdominal wall defects.   The family histories were otherwise unremarkable for updates regarding birth defects, intellectual disability, and known genetic conditions. Without further information regarding the provided family history, an accurate genetic risk cannot be calculated. Further genetic counseling is warranted if more information is obtained.  Ms. Havlik denied exposure to environmental toxins or chemical agents. She denied the use of alcohol or street drugs. She reported smoking approximately a half pack of cigarettes per day. The associations of smoking in pregnancy were reviewed and cessation encouraged. She denied significant viral illnesses during the course of her pregnancy. Her medical and surgical histories were contributory for one TAB as discussed previously in note.   I counseled this couple for approximately 40 minutes regarding the above risks and available options.   Quinn Plowman, MS,  Certified Genetic Counselor  01/23/2015

## 2015-01-26 ENCOUNTER — Other Ambulatory Visit (HOSPITAL_COMMUNITY): Payer: Self-pay | Admitting: Nurse Practitioner

## 2015-01-26 DIAGNOSIS — O289 Unspecified abnormal findings on antenatal screening of mother: Secondary | ICD-10-CM

## 2015-02-13 ENCOUNTER — Ambulatory Visit (HOSPITAL_COMMUNITY): Admission: RE | Admit: 2015-02-13 | Payer: Medicaid Other | Source: Ambulatory Visit

## 2015-02-27 ENCOUNTER — Ambulatory Visit (HOSPITAL_COMMUNITY)
Admission: RE | Admit: 2015-02-27 | Discharge: 2015-02-27 | Disposition: A | Discharge status: Home / Self Care | Payer: Medicaid Other | Source: Ambulatory Visit | Attending: Nurse Practitioner | Admitting: Nurse Practitioner

## 2015-02-27 ENCOUNTER — Encounter (HOSPITAL_COMMUNITY): Payer: Self-pay

## 2015-02-27 DIAGNOSIS — O352XX0 Maternal care for (suspected) hereditary disease in fetus, not applicable or unspecified: Secondary | ICD-10-CM | POA: Diagnosis not present

## 2015-02-27 DIAGNOSIS — Z3A19 19 weeks gestation of pregnancy: Secondary | ICD-10-CM | POA: Diagnosis not present

## 2015-02-27 DIAGNOSIS — Z36 Encounter for antenatal screening of mother: Secondary | ICD-10-CM | POA: Diagnosis present

## 2015-02-27 DIAGNOSIS — O289 Unspecified abnormal findings on antenatal screening of mother: Secondary | ICD-10-CM

## 2015-02-27 DIAGNOSIS — O283 Abnormal ultrasonic finding on antenatal screening of mother: Secondary | ICD-10-CM | POA: Insufficient documentation

## 2015-02-27 DIAGNOSIS — Z3689 Encounter for other specified antenatal screening: Secondary | ICD-10-CM | POA: Insufficient documentation

## 2015-11-28 ENCOUNTER — Encounter (HOSPITAL_COMMUNITY): Payer: Self-pay | Admitting: *Deleted

## 2016-05-23 ENCOUNTER — Ambulatory Visit: Payer: Self-pay | Admitting: Family

## 2016-05-27 ENCOUNTER — Encounter: Payer: Self-pay | Admitting: Family

## 2017-05-01 ENCOUNTER — Encounter (HOSPITAL_COMMUNITY): Payer: Self-pay | Admitting: *Deleted

## 2017-05-01 ENCOUNTER — Emergency Department (HOSPITAL_COMMUNITY): Payer: Medicaid Other

## 2017-05-01 ENCOUNTER — Emergency Department (HOSPITAL_COMMUNITY)
Admission: EM | Admit: 2017-05-01 | Discharge: 2017-05-01 | Disposition: A | Discharge status: Home / Self Care | Payer: Medicaid Other | Attending: Emergency Medicine | Admitting: Emergency Medicine

## 2017-05-01 DIAGNOSIS — T07XXXA Unspecified multiple injuries, initial encounter: Secondary | ICD-10-CM

## 2017-05-01 DIAGNOSIS — F17210 Nicotine dependence, cigarettes, uncomplicated: Secondary | ICD-10-CM | POA: Insufficient documentation

## 2017-05-01 DIAGNOSIS — Y939 Activity, unspecified: Secondary | ICD-10-CM | POA: Diagnosis not present

## 2017-05-01 DIAGNOSIS — S1091XA Abrasion of unspecified part of neck, initial encounter: Secondary | ICD-10-CM | POA: Insufficient documentation

## 2017-05-01 DIAGNOSIS — Z79899 Other long term (current) drug therapy: Secondary | ICD-10-CM | POA: Diagnosis not present

## 2017-05-01 DIAGNOSIS — S20219A Contusion of unspecified front wall of thorax, initial encounter: Secondary | ICD-10-CM | POA: Insufficient documentation

## 2017-05-01 DIAGNOSIS — M7918 Myalgia, other site: Secondary | ICD-10-CM

## 2017-05-01 DIAGNOSIS — Y92410 Unspecified street and highway as the place of occurrence of the external cause: Secondary | ICD-10-CM | POA: Diagnosis not present

## 2017-05-01 DIAGNOSIS — S50311A Abrasion of right elbow, initial encounter: Secondary | ICD-10-CM | POA: Diagnosis not present

## 2017-05-01 DIAGNOSIS — N3000 Acute cystitis without hematuria: Secondary | ICD-10-CM

## 2017-05-01 DIAGNOSIS — S0083XA Contusion of other part of head, initial encounter: Secondary | ICD-10-CM | POA: Insufficient documentation

## 2017-05-01 DIAGNOSIS — S8002XA Contusion of left knee, initial encounter: Secondary | ICD-10-CM | POA: Insufficient documentation

## 2017-05-01 DIAGNOSIS — Z23 Encounter for immunization: Secondary | ICD-10-CM | POA: Diagnosis not present

## 2017-05-01 DIAGNOSIS — S0993XA Unspecified injury of face, initial encounter: Secondary | ICD-10-CM | POA: Diagnosis present

## 2017-05-01 DIAGNOSIS — Y999 Unspecified external cause status: Secondary | ICD-10-CM | POA: Diagnosis not present

## 2017-05-01 LAB — URINALYSIS, MICROSCOPIC (REFLEX)

## 2017-05-01 LAB — POC URINE PREG, ED: Preg Test, Ur: NEGATIVE

## 2017-05-01 MED ORDER — CEPHALEXIN 500 MG PO CAPS: 500.0000 mg | ORAL_CAPSULE | Freq: Two times a day (BID) | ORAL | 0 refills | Status: AC

## 2017-05-01 MED ORDER — IBUPROFEN 800 MG PO TABS: 800.0000 mg | ORAL_TABLET | Freq: Three times a day (TID) | ORAL | 0 refills | Status: DC | PRN

## 2017-05-01 MED ORDER — TETANUS-DIPHTH-ACELL PERTUSSIS 5-2.5-18.5 LF-MCG/0.5 IM SUSP
0.5000 mL | Freq: Once | INTRAMUSCULAR | Status: AC
  Administered 2017-05-01: 0.5 mL via INTRAMUSCULAR
  Filled 2017-05-01: qty 0.5

## 2017-05-01 MED ORDER — CYCLOBENZAPRINE HCL 10 MG PO TABS: 10.0000 mg | ORAL_TABLET | Freq: Three times a day (TID) | ORAL | 0 refills | Status: DC | PRN

## 2017-05-01 MED ORDER — DIAZEPAM 5 MG PO TABS
5.0000 mg | ORAL_TABLET | Freq: Once | ORAL | Status: AC
  Administered 2017-05-01: 5 mg via ORAL
  Filled 2017-05-01: qty 1
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
## 2017-05-01 NOTE — ED Provider Notes (Signed)
[facility] end of shift pending results from UA and pregnancy test. All imaging had been reviewed and negative. Patient was prescribed Flexeril and Motrin and will follow-up with her pain management clinic for any further pain relief. Pregnancy negative and UA without signs of UTI will send home with Keflex. Patient was stable and ready to go home.   Gregary CromerMitchell, Cortasia Screws B, PA-C 05/01/17 2137    Rolland PorterJames, Mark, MD 05/12/17 [phone]

## 2017-05-01 NOTE — Discharge Instructions (Signed)
Read the information below.  Use the prescribed medication as directed.  Please discuss all new medications with your pharmacist.  You may return to the Emergency Department at any time for worsening condition or any new symptoms that concern you.     If you develop redness, swelling, pus draining from the wound, or fevers greater than 100.4, return to the ER immediately for a recheck.   °

## 2017-05-06 LAB — URINALYSIS, ROUTINE W REFLEX MICROSCOPIC
Bilirubin Urine: NEGATIVE
GLUCOSE, UA: NEGATIVE mg/dL
Ketones, ur: NEGATIVE mg/dL
NITRITE: NEGATIVE
Protein, ur: 30 mg/dL — AB
SPECIFIC GRAVITY, URINE: 1.02 (ref 1.005–1.030)
pH: 8 (ref 5.0–8.0)

## 2017-08-09 ENCOUNTER — Encounter: Payer: Self-pay | Admitting: Emergency Medicine

## 2017-08-09 ENCOUNTER — Emergency Department
Admission: EM | Admit: 2017-08-09 | Discharge: 2017-08-09 | Disposition: A | Discharge status: Home / Self Care | Payer: Medicaid Other | Attending: Student in an Organized Health Care Education/Training Program | Admitting: Student in an Organized Health Care Education/Training Program

## 2017-08-09 DIAGNOSIS — M25512 Pain in left shoulder: Secondary | ICD-10-CM

## 2017-08-09 DIAGNOSIS — S46912A Strain of unspecified muscle, fascia and tendon at shoulder and upper arm level, left arm, initial encounter: Secondary | ICD-10-CM

## 2017-08-09 DIAGNOSIS — J45909 Unspecified asthma, uncomplicated: Secondary | ICD-10-CM | POA: Insufficient documentation

## 2017-08-09 DIAGNOSIS — F17210 Nicotine dependence, cigarettes, uncomplicated: Secondary | ICD-10-CM | POA: Diagnosis not present

## 2017-08-09 DIAGNOSIS — Z79899 Other long term (current) drug therapy: Secondary | ICD-10-CM | POA: Diagnosis not present

## 2017-08-09 MED ORDER — CYCLOBENZAPRINE HCL 5 MG PO TABS: 5.0000 mg | ORAL_TABLET | Freq: Three times a day (TID) | ORAL | 0 refills | Status: AC | PRN

## 2017-08-09 MED ORDER — CYCLOBENZAPRINE HCL 10 MG PO TABS
5.0000 mg | ORAL_TABLET | Freq: Once | ORAL | Status: AC
  Administered 2017-08-09: 5 mg via ORAL
  Filled 2017-08-09: qty 1

## 2017-08-09 NOTE — Discharge Instructions (Signed)
Your exam is normal for any sign of rotator cuff tear or shoulder dislocation. You have a shoulder strain. Take the prescription muscle relaxant along with OTC Tylenol or ibuprofen. Apply ice to reduce pain. Follow-up with Dr. Joice LoftsPoggi for continued symptoms.

## 2017-08-09 NOTE — ED Notes (Addendum)
See triage note   States she woke up yesterday with some pain to left shoulder area  With some intermittent sharp pain into arm  States pain is worse today  Denies any injury  No deformity noted good pulses

## 2017-08-09 NOTE — ED Notes (Signed)
[facility] phone number [phone] for ride back

## 2017-08-09 NOTE — ED Provider Notes (Signed)
[facility] Emergency Department Provider Note ____________________________________________  Time seen: 1307  I have reviewed the triage vital signs and the nursing notes.  HISTORY  Chief Complaint  Arm Pain  HPI Patricia Orr is a 26 y.o. female presents to the ED for evaluation of left shoulder pain, upon awakening yesterday morning. She denies any recent injury, accident, or trauma.She denies any history of ongoing or chronic shoulder pain. She describes anterior shoulder pain that is aggravated by attempts to to extend or abduct the arm. She denies any grip changes or distal paresthesias.   Past Medical History:  Diagnosis Date  . Asthma     Patient Active Problem List   Diagnosis Date Noted  . Encounter for fetal anatomic survey   . [redacted] weeks gestation of pregnancy   . Hereditary disease in family possibly affecting fetus, affecting management of mother, antepartum condition or complication   . Abnormal findings on antenatal screening   . High risk pregnancy with low PAPPA (pregnancy-associated plasma protein A) 01/23/2015  . Family history of congenital malformation 01/23/2015  . [redacted] weeks gestation of pregnancy     Past Surgical History:  Procedure Laterality Date  . NO PAST SURGERIES      Prior to Admission medications   Medication Sig Start Date End Date Taking? Authorizing Provider  acetaminophen (TYLENOL) 500 MG tablet Take 500 mg by mouth every 6 (six) hours as needed. For pain    [provider]  albuterol (PROVENTIL HFA;VENTOLIN HFA) 108 (90 BASE) MCG/ACT inhaler Inhale 2 puffs into the lungs every 6 (six) hours as needed. For asthma    [provider]  cyclobenzaprine (FLEXERIL) 5 MG tablet Take 1 tablet (5 mg total) by mouth 3 (three) times daily as needed for muscle spasms. 08/09/17   Tamario Heal, Charlesetta Ivory, PA-C  ibuprofen (ADVIL,MOTRIN) 800 MG tablet Take 1 tablet (800 mg total) by mouth every 8 (eight) hours as  needed for mild pain or moderate pain. 05/01/17   Trixie Dredge, PA-C  ondansetron (ZOFRAN) 4 MG tablet Take 2 tablets (8 mg total) by mouth every 6 (six) hours. 01/05/13   Doug Sou, MD  Prenatal Vit-Fe Sulfate-FA (PRENATAL VITAMIN PO) Take 1 tablet by mouth daily.    [provider]    Allergies Patient has no known allergies.  Family History  Problem Relation Age of Onset  . Cancer Father        colon  . Cancer Maternal Grandmother   . Cancer Paternal Grandmother   . Cancer Paternal Grandfather        colon     Social History Social History  Substance Use Topics  . Smoking status: Light Tobacco Smoker    Types: Cigarettes  . Smokeless tobacco: Never Used  . Alcohol use No    Review of Systems  Constitutional: Negative for fever. Cardiovascular: Negative for chest pain. Respiratory: Negative for shortness of breath. Musculoskeletal: Negative for back pain. Left arm pain as above. Skin: Negative for rash. Neurological: Negative for headaches, focal weakness or numbness. ____________________________________________  PHYSICAL EXAM:  VITAL SIGNS: ED Triage Vitals [08/09/17 1149]  Enc Vitals Group     BP 99/76     Pulse Rate 100     Resp 16     Temp 98.5 F (36.9 C)     Temp Source Oral     SpO2 100 %     Weight 125 lb (56.7 kg)     Height  Head Circumference      Peak Flow      Pain Score 9     Pain Loc      Pain Edu?      Excl. in GC?     Constitutional: Alert and oriented. Well appearing and in no distress. Head: Normocephalic and atraumatic. Cardiovascular: Normal rate, regular rhythm. Normal distal pulses. Respiratory: Normal respiratory effort.  Musculoskeletal: left arm without any obvious deformity, dislocation, or sulcus sign. Patient with normal passive range of motion to the left upper extremity. She is only tender to palpation over the biceps tendon groove. She is able to pronate and supinate without difficulty. Normal rotator cuff  resistance testing. Normal shoulder internal and external rotation. Negative Yergason's. Negative Hawkins/Neer. Normal composite fists and grip strength. Nontender with normal range of motion in all extremities.  Neurologic:  Normal gait without ataxia. Normal speech and language. No gross focal neurologic deficits are appreciated. Skin:  Skin is warm, dry and intact. No rash noted. ____________________________________________  INITIAL IMPRESSION / ASSESSMENT AND PLAN / ED COURSE  Patient with ED evaluation of left arm pain at the shoulder. She is denying any recent injury or trauma. She will be discharged with a prescription for cyclobenzaprine to dose with OTC ibuprofen for pain and inflammation relief. She is referred to Dr. Joice LoftsPoggi for continued symptoms.  ____________________________________________  FINAL CLINICAL IMPRESSION(S) / ED DIAGNOSES  Final diagnoses:  Acute pain of left shoulder  Shoulder strain, left, initial encounter      Lissa HoardMenshew, [place], PA-C 08/09/17 1724    Willy Eddyobinson, Patrick, MD 08/09/17 2100

## 2017-08-09 NOTE — ED Triage Notes (Signed)
Pt to ED from [facility], states that they sent her over to be evaluated for left arm pain. Pt states that she woke up yesterday morning and her arm was hurting, over the day the pain got worse. Pt states that when she moves her fingers the pain in her shoulder gets worse. No obvious deformities noted.

## 2017-08-12 ENCOUNTER — Emergency Department (HOSPITAL_COMMUNITY): Payer: Medicaid Other

## 2017-08-12 ENCOUNTER — Emergency Department (HOSPITAL_COMMUNITY)
Admission: EM | Admit: 2017-08-12 | Discharge: 2017-08-12 | Disposition: A | Discharge status: Home / Self Care | Payer: Medicaid Other | Attending: Emergency Medicine | Admitting: Emergency Medicine

## 2017-08-12 ENCOUNTER — Encounter (HOSPITAL_COMMUNITY): Payer: Self-pay

## 2017-08-12 DIAGNOSIS — M25512 Pain in left shoulder: Secondary | ICD-10-CM | POA: Diagnosis present

## 2017-08-12 DIAGNOSIS — F17210 Nicotine dependence, cigarettes, uncomplicated: Secondary | ICD-10-CM | POA: Diagnosis not present

## 2017-08-12 MED ORDER — TRAMADOL HCL 50 MG PO TABS
50.0000 mg | ORAL_TABLET | Freq: Four times a day (QID) | ORAL | 0 refills | Status: AC | PRN
Start: 2017-08-12 — End: ?

## 2017-08-12 MED ORDER — OXYCODONE-ACETAMINOPHEN 5-325 MG PO TABS
1.0000 | ORAL_TABLET | Freq: Once | ORAL | Status: AC
  Administered 2017-08-12: 1 via ORAL
  Filled 2017-08-12: qty 1

## 2017-08-12 NOTE — ED Triage Notes (Signed)
Pt complains of left shoulder pain since she woke up Saturday, no injury noted Pt was seen at [facility] this week, [facility] xrays [facility] she doesn't [facility] near there

## 2017-08-12 NOTE — ED Provider Notes (Signed)
[facility]-EMERGENCY DEPT Provider Note   CSN: [id number] Arrival date & time: 08/12/17  1827     History   Chief Complaint Chief Complaint  Patient presents with  . Shoulder Pain    HPI Patricia Orr is a 26 y.o. female.  HPI  26 y.o. female, presents to the Emergency Department today due to left shoulder pain since Saturday. Notes waking up with pain. No trauma or injuries noted. Seen at [facility] on 08-09-17 for same. DCed home with follow up to Ortho. Pt states she does not live in [place] and was curious why she had a referral out there. She lives in [place]. No numbness to area. Rates pain 10/10 with movement. Minimal pain at rest. Ice/motrin/tylenol with minimal relief. No fevers. No CP/SOB. No other symptoms noted    Past Medical History:  Diagnosis Date  . Asthma     Patient Active Problem List   Diagnosis Date Noted  . Encounter for fetal anatomic survey   . [redacted] weeks gestation of pregnancy   . Hereditary disease in family possibly affecting fetus, affecting management of mother, antepartum condition or complication   . Abnormal findings on antenatal screening   . High risk pregnancy with low PAPPA (pregnancy-associated plasma protein A) 01/23/2015  . Family history of congenital malformation 01/23/2015  . [redacted] weeks gestation of pregnancy     Past Surgical History:  Procedure Laterality Date  . NO PAST SURGERIES      OB History    Gravida Para Term Preterm AB Living   2 0     1 0   SAB TAB Ectopic Multiple Live Births     1             Home Medications    Prior to Admission medications   Medication Sig Start Date End Date Taking? Authorizing Provider  acetaminophen (TYLENOL) 500 MG tablet Take 500 mg by mouth every 6 (six) hours as needed. For pain    [provider]  albuterol (PROVENTIL HFA;VENTOLIN HFA) 108 (90 BASE) MCG/ACT inhaler Inhale 2 puffs into the lungs every 6 (six) hours as needed. For asthma    [provider]  cyclobenzaprine  (FLEXERIL) 5 MG tablet Take 1 tablet (5 mg total) by mouth 3 (three) times daily as needed for muscle spasms. 08/09/17   Menshew, Charlesetta Ivory, PA-C  ibuprofen (ADVIL,MOTRIN) 800 MG tablet Take 1 tablet (800 mg total) by mouth every 8 (eight) hours as needed for mild pain or moderate pain. 05/01/17   Trixie Dredge, PA-C  ondansetron (ZOFRAN) 4 MG tablet Take 2 tablets (8 mg total) by mouth every 6 (six) hours. 01/05/13   Doug Sou, MD  Prenatal Vit-Fe Sulfate-FA (PRENATAL VITAMIN PO) Take 1 tablet by mouth daily.    [provider]    Family History Family History  Problem Relation Age of Onset  . Cancer Father        colon  . Cancer Maternal Grandmother   . Cancer Paternal Grandmother   . Cancer Paternal Grandfather        colon     Social History Social History  Substance Use Topics  . Smoking status: Light Tobacco Smoker    Types: Cigarettes  . Smokeless tobacco: Never Used  . Alcohol use No     Allergies   Patient has no known allergies.   Review of Systems Review of Systems  Respiratory: Negative for shortness of breath.   Cardiovascular: Negative for chest pain.  Gastrointestinal:  Negative for nausea.  Musculoskeletal: Positive for arthralgias.  Neurological: Negative for numbness.     Physical Exam Updated Vital Signs BP 130/82 (BP Location: Right Arm)   Pulse 94   Temp 99.3 F (37.4 C) (Oral)   Resp 17   Ht 5\' 2"  (1.575 m)   Wt 56.2 kg (124 lb)   LMP 07/13/2017   SpO2 98%   BMI 22.68 kg/m   Physical Exam  Constitutional: She is oriented to person, place, and time. Vital signs are normal. She appears well-developed and well-nourished.  HENT:  Head: Normocephalic.  Right Ear: Hearing normal.  Left Ear: Hearing normal.  Eyes: Pupils are equal, round, and reactive to light. Conjunctivae and EOM are normal.  Cardiovascular: Normal rate and regular rhythm.   Pulmonary/Chest: Effort normal.  Musculoskeletal:  Left shoulder with limited  Passive ROM. NVI. Distal pulses appreciated. TTP along shoulder joint without obvious swelling or erythema   Neurological: She is alert and oriented to person, place, and time.  Skin: Skin is warm and dry.  Psychiatric: She has a normal mood and affect. Her speech is normal and behavior is normal. Thought content normal.  Nursing note and vitals reviewed.    ED Treatments / Results  Labs (all labs ordered are listed, but only abnormal results are displayed) Labs Reviewed - No data to display  EKG  EKG Interpretation None       Radiology Dg Shoulder Left  Result Date: 08/12/2017 CLINICAL DATA:  Left shoulder pain EXAM: LEFT SHOULDER - 2+ VIEW COMPARISON:  None. FINDINGS: There is no evidence of fracture or dislocation. There is no evidence of arthropathy or other focal bone abnormality. Soft tissues are unremarkable. IMPRESSION: Negative. Electronically Signed   By: Jasmine PangKim  Fujinaga M.D.   On: 08/12/2017 19:51    Procedures Procedures (including critical care time)  Medications Ordered in ED Medications  oxyCODONE-acetaminophen (PERCOCET/ROXICET) 5-325 MG per tablet 1 tablet (not administered)     Initial Impression / Assessment and Plan / ED Course  I have reviewed the triage vital signs and the nursing notes.  Pertinent labs & imaging results that were available during my care of the patient were reviewed by me and considered in my medical decision making (see chart for details).  Final Clinical Impressions(s) / ED Diagnoses   {I have reviewed and evaluated the relevant imaging studies.  {I have reviewed the relevant previous healthcare records.  {I obtained HPI from historian.   ED Course:  Assessment: Patient X-Ray negative for obvious fracture or dislocation. Possible impingement vs rotator cuff tear. Pt advised to follow up with orthopedics. Patient given sling while in ED, conservative therapy recommended and discussed. Patient will be discharged home & is agreeable  with above plan. Returns precautions discussed. I have reviewed the [place] Controlled Substance Reporting System. Given Rx Tramadol. Pt appears safe for discharge  Disposition/Plan:  DC Home Additional Verbal discharge instructions given and discussed with patient.  Pt Instructed to f/u with Ortho in the next week for evaluation and treatment of symptoms. Return precautions given Pt acknowledges and agrees with plan  Supervising Physician Alvira MondaySchlossman, Erin, MD  Final diagnoses:  Acute pain of left shoulder    New Prescriptions New Prescriptions   No medications on file     Audry PiliMohr, Delorus Langwell, Cordelia Poche-C 08/12/17 2045    Alvira MondaySchlossman, Erin, MD 08/14/17 [phone]

## 2019-05-15 ENCOUNTER — Emergency Department (HOSPITAL_COMMUNITY): Payer: Medicaid Other

## 2019-05-15 ENCOUNTER — Emergency Department (HOSPITAL_COMMUNITY)
Admission: EM | Admit: 2019-05-15 | Discharge: 2019-05-15 | Disposition: A | Discharge status: Home / Self Care | Payer: Medicaid Other | Attending: Emergency Medicine | Admitting: Emergency Medicine

## 2019-05-15 ENCOUNTER — Other Ambulatory Visit: Payer: Self-pay

## 2019-05-15 DIAGNOSIS — J45909 Unspecified asthma, uncomplicated: Secondary | ICD-10-CM | POA: Diagnosis not present

## 2019-05-15 DIAGNOSIS — F17210 Nicotine dependence, cigarettes, uncomplicated: Secondary | ICD-10-CM | POA: Insufficient documentation

## 2019-05-15 DIAGNOSIS — R600 Localized edema: Secondary | ICD-10-CM | POA: Diagnosis present

## 2019-05-15 DIAGNOSIS — L03211 Cellulitis of face: Secondary | ICD-10-CM | POA: Insufficient documentation

## 2019-05-15 LAB — BASIC METABOLIC PANEL
Anion gap: 8 (ref 5–15)
BUN: 7 mg/dL (ref 6–20)
CO2: 22 mmol/L (ref 22–32)
Calcium: 9 mg/dL (ref 8.9–10.3)
Chloride: 102 mmol/L (ref 98–111)
Creatinine, Ser: 0.47 mg/dL (ref 0.44–1.00)
GFR calc Af Amer: >60 mL/min (ref >60)
GFR calc non Af Amer: >60 mL/min (ref >60)
Glucose, Bld: 99 mg/dL (ref 70–99)
Potassium: 4.1 mmol/L (ref 3.5–5.1)
Sodium: 132 mmol/L — ABNORMAL LOW (ref 135–145)

## 2019-05-15 LAB — CBC WITH DIFFERENTIAL/PLATELET
Abs Immature Granulocytes: 0.06 10*3/uL (ref 0.00–0.07)
Basophils Absolute: 0.1 10*3/uL (ref 0.0–0.1)
Basophils Relative: 0 %
Eosinophils Absolute: 0.1 10*3/uL (ref 0.0–0.5)
Eosinophils Relative: 0 %
HCT: 40.4 % (ref 36.0–46.0)
Hemoglobin: 12.8 g/dL (ref 12.0–15.0)
Immature Granulocytes: 0 %
Lymphocytes Relative: 18 %
Lymphs Abs: 2.4 10*3/uL (ref 0.7–4.0)
MCH: 29.8 pg (ref 26.0–34.0)
MCHC: 31.7 g/dL (ref 30.0–36.0)
MCV: 94.2 fL (ref 80.0–100.0)
Monocytes Absolute: 0.8 10*3/uL (ref 0.1–1.0)
Monocytes Relative: 6 %
Neutro Abs: 10.2 10*3/uL — ABNORMAL HIGH (ref 1.7–7.7)
Neutrophils Relative %: 76 %
Platelets: 281 10*3/uL (ref 150–400)
RBC: 4.29 MIL/uL (ref 3.87–5.11)
RDW: 13.2 % (ref 11.5–15.5)
WBC: 13.6 10*3/uL — ABNORMAL HIGH (ref 4.0–10.5)
nRBC: 0 % (ref 0.0–0.2)

## 2019-05-15 MED ORDER — CLINDAMYCIN HCL 150 MG PO CAPS: 300.0000 mg | ORAL_CAPSULE | Freq: Four times a day (QID) | ORAL | 0 refills | Status: AC

## 2019-05-15 MED ORDER — CLINDAMYCIN PHOSPHATE 600 MG/50ML IV SOLN
600.0000 mg | Freq: Once | INTRAVENOUS | Status: AC
  Administered 2019-05-15: 600 mg via INTRAVENOUS
  Filled 2019-05-15: qty 50

## 2019-05-15 MED ORDER — MORPHINE SULFATE (PF) 4 MG/ML IV SOLN
4.0000 mg | Freq: Once | INTRAVENOUS | Status: AC
  Administered 2019-05-15: 18:00:00 4 mg via INTRAVENOUS
  Filled 2019-05-15: qty 1

## 2019-05-15 MED ORDER — IOHEXOL 300 MG/ML  SOLN
75.0000 mL | Freq: Once | INTRAMUSCULAR | Status: AC | PRN
  Administered 2019-05-15: 20:00:00 75 mL via INTRAVENOUS

## 2019-05-15 MED ORDER — IBUPROFEN 800 MG PO TABS: 800.0000 mg | ORAL_TABLET | Freq: Three times a day (TID) | ORAL | 0 refills | Status: AC | PRN

## 2019-05-15 NOTE — ED Triage Notes (Signed)
Pt presents for right sided jaw swelling x1 day. Pt states that last night she popped a pimple and this morning when she woke up it was swollen. Pt endorses intermittent nausea. Pt endorses limited ROM to jaw due to pain.

## 2019-05-15 NOTE — ED Provider Notes (Signed)
[facility] EMERGENCY DEPARTMENT Provider Note   CSN: [id number] Arrival date & time: 05/15/19  1710    History   Chief Complaint No chief complaint on file.   HPI Patricia Orr is a 28 y.o. female.     The history is provided by the patient and medical records. No language interpreter was used.   Patricia Orr is a 28 y.o. female  with a PMH as listed below who presents to the Emergency Department complaining of right facial / jaw swelling which began yesterday. Patient states that she thought she had a pimple and tried to pop it. Nothing came out. This morning, when she woke up, it was much more swollen and painful. Area has continued to worsen throughout the day.  She has taken Tylenol and ibuprofen for the pain with little improvement.  Denies any pain to the dentition.  No sore throat or difficulty swallowing.  No chest pain or shortness of breath.  No fevers.  Denies history of similar.   Past Medical History:  Diagnosis Date  . Asthma     Patient Active Problem List   Diagnosis Date Noted  . Encounter for fetal anatomic survey   . [redacted] weeks gestation of pregnancy   . Hereditary disease in family possibly affecting fetus, affecting management of mother, antepartum condition or complication   . Abnormal findings on antenatal screening   . High risk pregnancy with low PAPPA (pregnancy-associated plasma protein A) 01/23/2015  . Family history of congenital malformation 01/23/2015  . [redacted] weeks gestation of pregnancy     Past Surgical History:  Procedure Laterality Date  . NO PAST SURGERIES       OB History    Gravida  2   Para  0   Term      Preterm      AB  1   Living  0     SAB      TAB  1   Ectopic      Multiple      Live Births               Home Medications    Prior to Admission medications   Medication Sig Start Date End Date Taking? Authorizing Provider  acetaminophen (TYLENOL) 500 MG tablet Take 500 mg by mouth  every 6 (six) hours as needed. For pain    [provider]  albuterol (PROVENTIL HFA;VENTOLIN HFA) 108 (90 BASE) MCG/ACT inhaler Inhale 2 puffs into the lungs every 6 (six) hours as needed. For asthma    [provider]  clindamycin (CLEOCIN) 150 MG capsule Take 2 capsules (300 mg total) by mouth 4 (four) times daily. 05/15/19   Ward, Chase Picket, PA-C  cyclobenzaprine (FLEXERIL) 5 MG tablet Take 1 tablet (5 mg total) by mouth 3 (three) times daily as needed for muscle spasms. 08/09/17   Menshew, Charlesetta Ivory, PA-C  ibuprofen (ADVIL) 800 MG tablet Take 1 tablet (800 mg total) by mouth every 8 (eight) hours as needed. 05/15/19   Ward, Chase Picket, PA-C  ondansetron (ZOFRAN) 4 MG tablet Take 2 tablets (8 mg total) by mouth every 6 (six) hours. 01/05/13   Doug Sou, MD  Prenatal Vit-Fe Sulfate-FA (PRENATAL VITAMIN PO) Take 1 tablet by mouth daily.    [provider]  traMADol (ULTRAM) 50 MG tablet Take 1 tablet (50 mg total) by mouth every 6 (six) hours as needed. 08/12/17   Audry Pili, PA-C    Family  History Family History  Problem Relation Age of Onset  . Cancer Father        colon  . Cancer Maternal Grandmother   . Cancer Paternal Grandmother   . Cancer Paternal Grandfather        colon     Social History Social History   Tobacco Use  . Smoking status: Light Tobacco Smoker    Types: Cigarettes  . Smokeless tobacco: Never Used  Substance Use Topics  . Alcohol use: No  . Drug use: No     Allergies   Patient has no known allergies.   Review of Systems Review of Systems  HENT: Positive for facial swelling. Negative for dental problem, sore throat, trouble swallowing and voice change.   All other systems reviewed and are negative.    Physical Exam Updated Vital Signs BP 111/70   Pulse 99   Temp 98.7 F (37.1 C) (Oral)   Resp 14   SpO2 100%   Physical Exam Vitals signs and nursing note reviewed.  Constitutional:      General: She is  not in acute distress.    Appearance: She is well-developed.  HENT:     Head: Normocephalic and atraumatic.     Comments: The image below of right side of her face.  Scabbed area close to the chin with surrounding erythema.  Significant amount of swelling.  This is warm to the touch.  She is tender with some swelling to the submandibular space underneath as well.  She has no sublingual tenderness.  She has no tenderness to the dentition or gums.  Oropharynx is clear with patent airway. Neck:     Musculoskeletal: Neck supple.  Cardiovascular:     Rate and Rhythm: Normal rate and regular rhythm.     Heart sounds: Normal heart sounds. No murmur.  Pulmonary:     Effort: Pulmonary effort is normal. No respiratory distress.     Breath sounds: Normal breath sounds.     Comments: Speaking in full sentences well.  Tolerating secretions fine.  Normal phonation.  Lungs clear to auscultation bilaterally. Abdominal:     General: There is no distension.     Palpations: Abdomen is soft.     Tenderness: There is no abdominal tenderness.  Skin:    General: Skin is warm and dry.  Neurological:     Mental Status: She is alert and oriented to person, place, and time.        ED Treatments / Results  Labs (all labs ordered are listed, but only abnormal results are displayed) Labs Reviewed  BASIC METABOLIC PANEL - Abnormal; Notable for the following components:      Result Value   Sodium 132 (*)    All other components within normal limits  CBC WITH DIFFERENTIAL/PLATELET - Abnormal; Notable for the following components:   WBC 13.6 (*)    Neutro Abs 10.2 (*)    All other components within normal limits    EKG None  Radiology Ct Maxillofacial W Contrast  Result Date: 05/15/2019 CLINICAL DATA:  28 y/o  F; right-sided jaw swelling for 1 day. EXAM: CT MAXILLOFACIAL WITH CONTRAST TECHNIQUE: Multidetector CT imaging of the maxillofacial structures was performed with intravenous contrast.  Multiplanar CT image reconstructions were also generated. CONTRAST:  75mL OMNIPAQUE IOHEXOL 300 MG/ML  SOLN COMPARISON:  None. FINDINGS: Osseous: No fracture or mandibular dislocation. Periapical cyst associated with the left maxillary first molar. Orbits: Negative. No traumatic or inflammatory finding. Sinuses: Mild mucosal thickening  of the maxillary sinus antrum. No sinus fluid levels. Normal aeration of the included mastoid air cells. Soft tissues: Edema/swelling within the superficial soft tissues of the right face and upper neck. No abscess or extension into the deep facial/cervical compartments identified. Limited intracranial: No significant or unexpected finding. IMPRESSION: Inflammation within the superficial soft tissues of the right face and upper neck compatible with cellulitis. No abscess or extension into deep facial/cervical compartments identified. Electronically Signed   By: Mitzi Hansen M.D.   On: 05/15/2019 20:01    Procedures Procedures (including critical care time)  Medications Ordered in ED Medications  clindamycin (CLEOCIN) IVPB 600 mg (600 mg Intravenous New Bag/Given 05/15/19 2120)  morphine 4 MG/ML injection 4 mg (4 mg Intravenous Given 05/15/19 1756)  iohexol (OMNIPAQUE) 300 MG/ML solution 75 mL (75 mLs Intravenous Contrast Given 05/15/19 1944)     Initial Impression / Assessment and Plan / ED Course  I have reviewed the triage vital signs and the nursing notes.  Pertinent labs & imaging results that were available during my care of the patient were reviewed by me and considered in my medical decision making (see chart for details).       Patricia Orr is a 28 y.o. female who presents to ED for facial swelling after picking at an area she believed to be a pimple yesterday.  On exam, patient is afebrile, hemodynamically stable with significant amount of swelling to the right jaw.  Does mostly appear to be to the face, however she does have some tenderness  to the submandibular region.  She is speaking in full sentences,'s tolerating secretions fine and has no complaints of difficulty with swallowing, speaking or breathing.  Given the amount of swelling and submandibular tenderness, CT scan was obtained showing right face and upper neck cellulitis without abscess or extension into deep space.  Given a dose of IV clindamycin in the emergency department and will be started on Cl[place] as an outpatient.  We discussed reasons to return to the emergency department at length including worsening of her swelling or difficulty with breathing/swallowing.  PCP follow-up for wound check encouraged.  All questions answered.  Patient discussed with Dr. Rhunette Croft who agrees with treatment plan.   Final Clinical Impressions(s) / ED Diagnoses   Final diagnoses:  Facial cellulitis    ED Discharge Orders         Ordered    clindamycin (CLEOCIN) 150 MG capsule  4 times daily     05/15/19 2116    ibuprofen (ADVIL) 800 MG tablet  Every 8 hours PRN     05/15/19 2116           Ward, Chase Picket, PA-C 05/15/19 2146    Derwood Kaplan, MD 05/16/19 1940

## 2019-05-15 NOTE — Discharge Instructions (Addendum)
It was my pleasure taking care of you today!   Please take all of your antibiotics until finished!   Ibuprofen as needed for pain. You can take Tylenol as needed as well.  Ice to the area will help with swelling.   Call your primary care doctor tomorrow morning to schedule a follow up appointment for recheck.   Please return to the ER if your swelling is getting worse, you have trouble swallowing, new symptoms develop or you have any additional concerns.

## 2021-01-23 IMAGING — CT CT MAXILLOFACIAL WITH CONTRAST
3 series · 16 of 47 positions shown, 19 images · IV contrast (Omni 300)
Comparison: None.

CLINICAL DATA: 27 y/o  F; right-sided jaw swelling for 1 day.

EXAM:
CT MAXILLOFACIAL WITH CONTRAST
TECHNIQUE: Multidetector CT imaging of the maxillofacial structures was
performed with intravenous contrast. Multiplanar CT image
reconstructions were also generated.
CONTRAST:  75mL OMNIPAQUE IOHEXOL 300 MG/ML  SOLN

[Series 3: facialbone 2.0 st · axial · 0.33mm/px · z∈[+1186,+1326]mm · 10 of 82 slices shown, 13 images]
[im 6/82  brain]
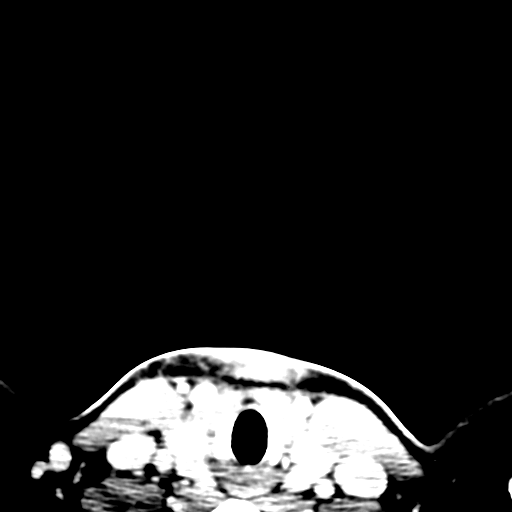
[im 6/82  bone]
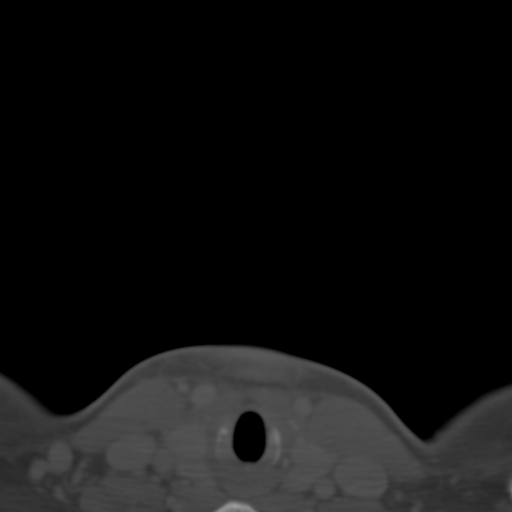
[im 14/82  bone]
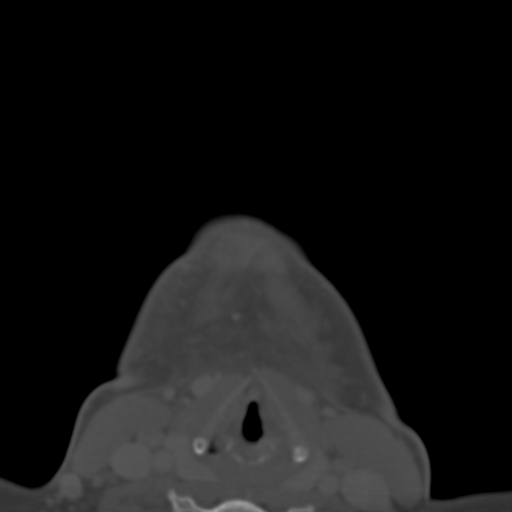
[im 23/82  bone]
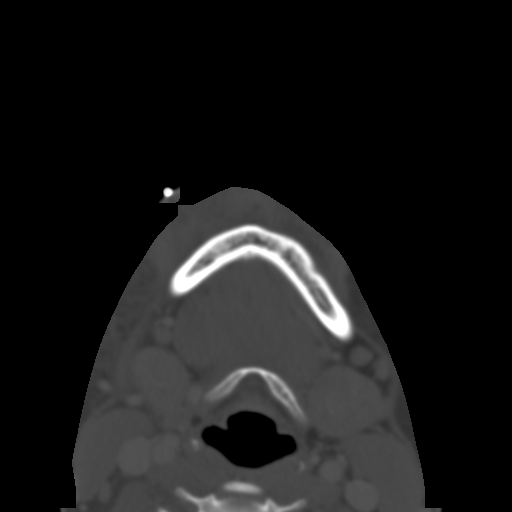
[im 28/82  bone]
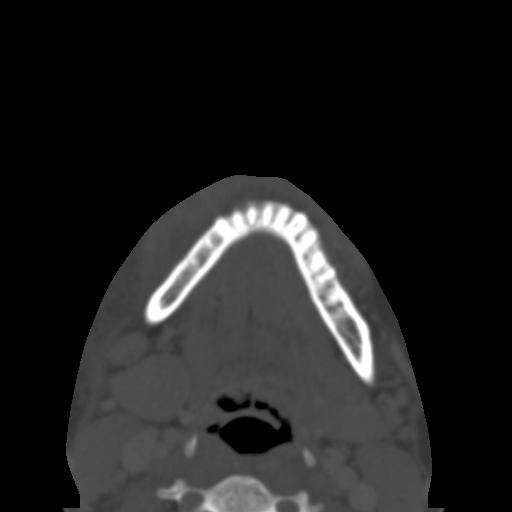
[im 37/82  brain]
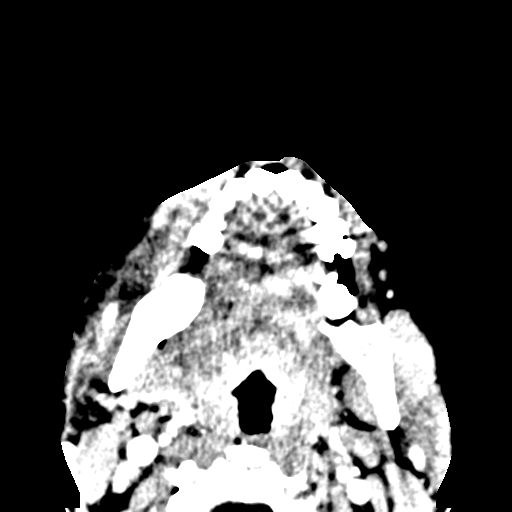
[im 37/82  bone]
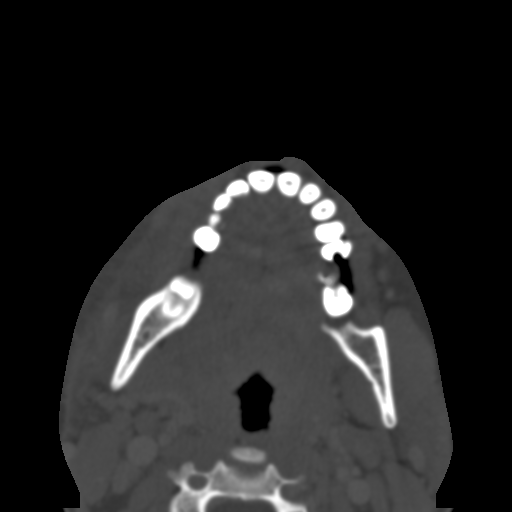
[im 45/82  bone]
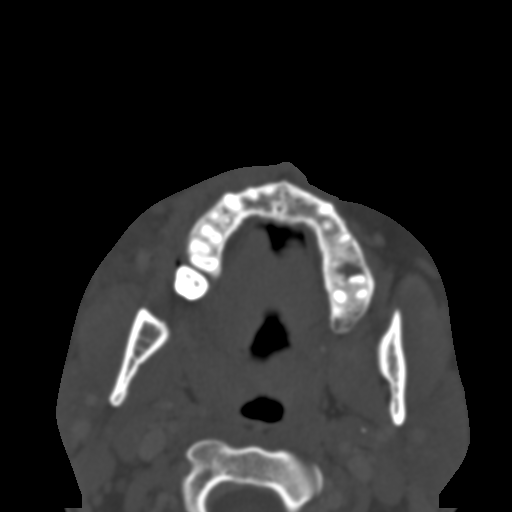
[im 54/82  bone]
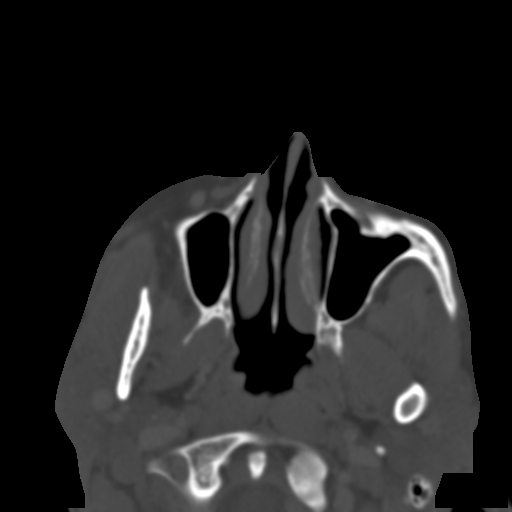
[im 62/82  bone]
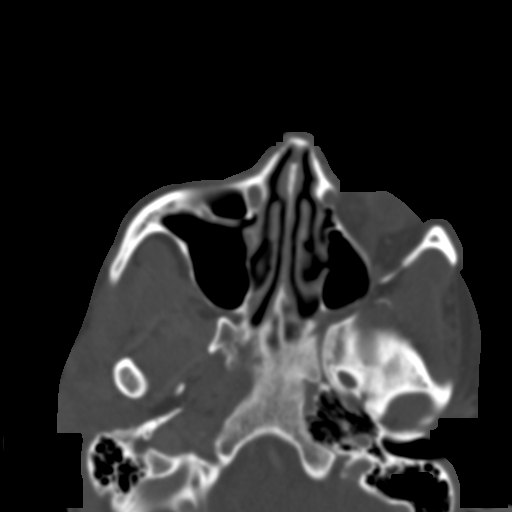
[im 68/82  brain]
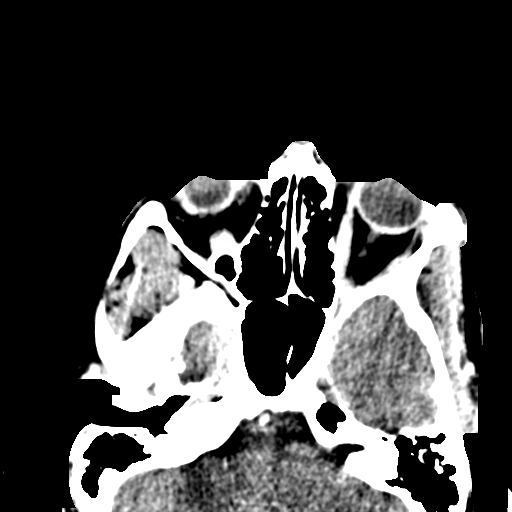
[im 68/82  bone]
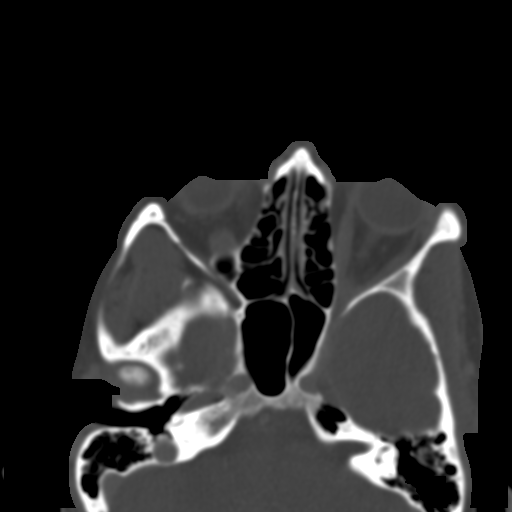
[im 76/82  bone]
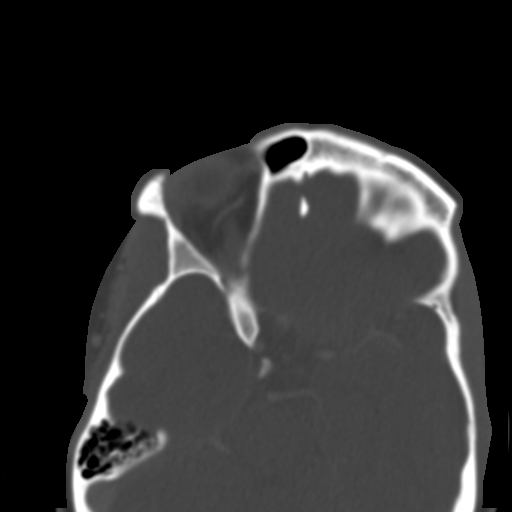

[Series 6: facialbone 2.0 cor st · coronal · 0.32mm/px · 3 of 80 slices shown]
[im 27/80  bone]
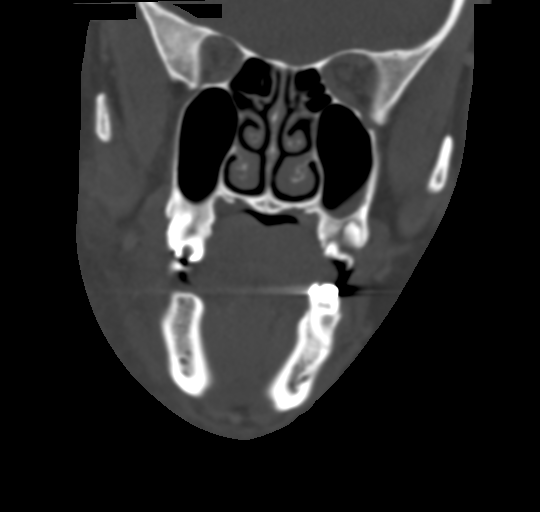
[im 36/80  bone]
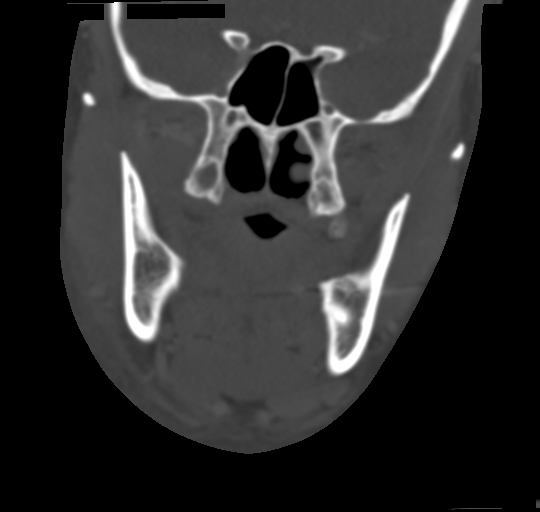
[im 44/80  bone]
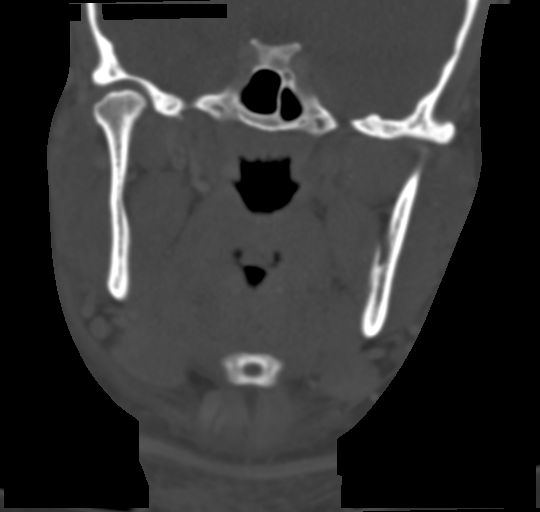

[Series 8: facialbone 2.0 sag st · sagittal · 0.31mm/px · 3 of 77 slices shown]
[im 26/77  bone]
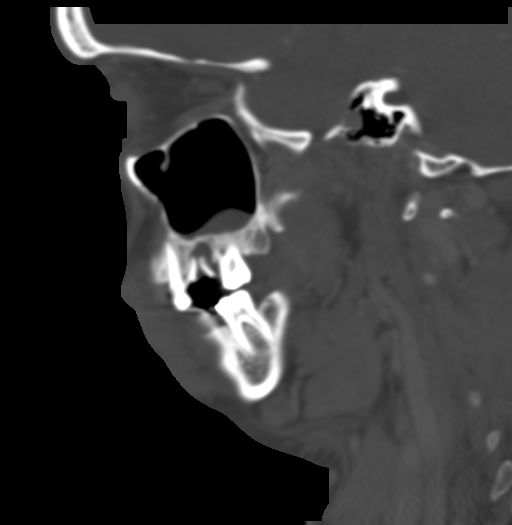
[im 39/77  bone]
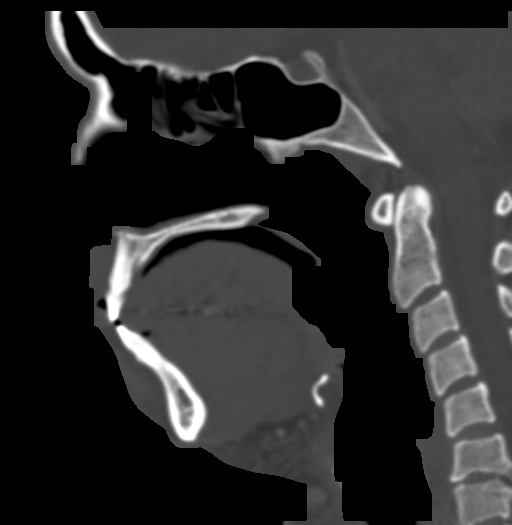
[im 51/77  bone]
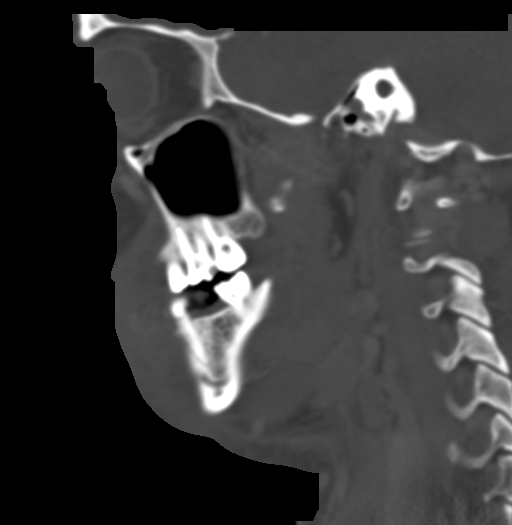

[16 of 47 positions shown; findings below may reference images not displayed]

FINDINGS: Osseous: No fracture or mandibular dislocation. Periapical cyst
associated with the left maxillary first molar.

Orbits: Negative. No traumatic or inflammatory finding.

Sinuses: Mild mucosal thickening of the maxillary sinus antrum. No
sinus fluid levels. Normal aeration of the included mastoid air
cells.

Soft tissues: Edema/swelling within the superficial soft tissues of
the right face and upper neck. No abscess or extension into the deep
facial/cervical compartments identified.

Limited intracranial: No significant or unexpected finding.
IMPRESSION: Inflammation within the superficial soft tissues of the right face
and upper neck compatible with cellulitis. No abscess or extension
into deep facial/cervical compartments identified.

## 2022-03-28 ENCOUNTER — Ambulatory Visit: Payer: Self-pay

## 2022-05-06 ENCOUNTER — Other Ambulatory Visit: Payer: Self-pay

## 2022-05-06 DIAGNOSIS — D508 Other iron deficiency anemias: Secondary | ICD-10-CM | POA: Insufficient documentation

## 2022-05-07 ENCOUNTER — Telehealth: Payer: Self-pay | Admitting: Pharmacy Technician

## 2022-05-07 NOTE — Telephone Encounter (Addendum)
Patient insurance card will need to be scanned. ?[place] [employer]ICA[employer]: Active ? ?Phone: [phone] ?Id: [id number] ?GR: [id number] ? ?Auth Submission: no auth needed ?Payer: [place] [employer] [place] blue ?Medication & CPT/J Code(s) submitted: Feraheme (ferumoxytol) [id number] ?Route of submission (phone, fax, portal): phone: [phone] ?Auth type: Buy/Bill ?Units/visits requested: x2 doses ?Reference number: [id number] ?Approval from: 05/07/22 to 09/07/22  ?

## 2022-05-12 ENCOUNTER — Encounter: Payer: Self-pay | Admitting: Pulmonary Disease

## 2022-05-16 ENCOUNTER — Ambulatory Visit (INDEPENDENT_AMBULATORY_CARE_PROVIDER_SITE_OTHER): Payer: Medicaid Other

## 2022-05-16 VITALS — BP 98/58 | HR 75 | Temp 98.0°F | Resp 16 | Ht 61.0 in | Wt 202.8 lb

## 2022-05-16 DIAGNOSIS — D508 Other iron deficiency anemias: Secondary | ICD-10-CM | POA: Diagnosis not present

## 2022-05-16 MED ORDER — SODIUM CHLORIDE 0.9 % IV SOLN
510.0000 mg | Freq: Once | INTRAVENOUS | Status: AC
  Administered 2022-05-16: 510 mg via INTRAVENOUS
  Filled 2022-05-16: qty 17

## 2022-05-16 NOTE — Progress Notes (Signed)
Diagnosis: Iron Deficiency Anemia  Provider:  Marshell Garfinkel, MD  Procedure: Infusion  IV Type: Peripheral, IV Location: R Forearm  Feraheme (Ferumoxytol), Dose: 510 mg  Infusion Start Time: [id number]  Infusion Stop Time: [id number]  Post Infusion IV Care: Observation period completed and Peripheral IV Discontinued  Discharge: Condition: Good, Destination: Home . AVS provided to patient.   Performed by:  Adelina Mings, LPN

## 2022-05-23 ENCOUNTER — Ambulatory Visit: Payer: Medicaid Other

## 2022-05-30 ENCOUNTER — Ambulatory Visit (INDEPENDENT_AMBULATORY_CARE_PROVIDER_SITE_OTHER): Payer: Medicaid Other

## 2022-05-30 VITALS — BP 112/69 | HR 78 | Temp 98.3°F | Resp 18 | Ht 61.0 in | Wt 205.4 lb

## 2022-05-30 DIAGNOSIS — D508 Other iron deficiency anemias: Secondary | ICD-10-CM | POA: Diagnosis not present

## 2022-05-30 MED ORDER — SODIUM CHLORIDE 0.9 % IV SOLN
510.0000 mg | Freq: Once | INTRAVENOUS | Status: AC
  Administered 2022-05-30: 510 mg via INTRAVENOUS
  Filled 2022-05-30: qty 17

## 2022-05-30 NOTE — Progress Notes (Signed)
Diagnosis: Iron Deficiency Anemia  Provider:  Chilton Greathouse, MD  Procedure: Infusion  IV Type: Peripheral, IV Location: R Forearm  Venofer (Iron Sucrose), Dose: 200 mg  Infusion Start Time: 1415  Infusion Stop Time: 1436  Post Infusion IV Care: Peripheral IV Discontinued  Discharge: Condition: Good, Destination: Home . AVS provided to patient.   Performed by:  Garnette Czech, RN
